# Patient Record
Sex: Female | Born: 1970 | Race: White | Hispanic: No | Marital: Married | Smoking: Never smoker
Health system: Southern US, Community
[De-identification: ages and names within clinical notes are randomized; demographics above are authoritative.]

## PROBLEM LIST (undated history)

## (undated) DIAGNOSIS — F419 Anxiety disorder, unspecified: Secondary | ICD-10-CM

## (undated) DIAGNOSIS — N9489 Other specified conditions associated with female genital organs and menstrual cycle: Secondary | ICD-10-CM

## (undated) DIAGNOSIS — R040 Epistaxis: Secondary | ICD-10-CM

## (undated) DIAGNOSIS — K219 Gastro-esophageal reflux disease without esophagitis: Secondary | ICD-10-CM

## (undated) DIAGNOSIS — K589 Irritable bowel syndrome without diarrhea: Secondary | ICD-10-CM

## (undated) DIAGNOSIS — N281 Cyst of kidney, acquired: Secondary | ICD-10-CM

## (undated) DIAGNOSIS — M1990 Unspecified osteoarthritis, unspecified site: Secondary | ICD-10-CM

## (undated) DIAGNOSIS — H8109 Meniere's disease, unspecified ear: Secondary | ICD-10-CM

## (undated) HISTORY — PX: ABLATION: SHX5711

## (undated) HISTORY — PX: APPENDECTOMY: SHX54

## (undated) HISTORY — PX: TUBAL LIGATION: SHX77

## (undated) HISTORY — PX: WISDOM TOOTH EXTRACTION: SHX21

## (undated) HISTORY — PX: TONSILLECTOMY: SUR1361

## (undated) HISTORY — PX: LAPAROSCOPY ABDOMEN DIAGNOSTIC: PRO50

## (undated) HISTORY — DX: Epistaxis: R04.0

## (undated) HISTORY — PX: ABDOMINAL HYSTERECTOMY: SHX81

## (undated) HISTORY — DX: Meniere's disease, unspecified ear: H81.09

## (undated) HISTORY — PX: BREAST SURGERY: SHX581

## (undated) HISTORY — DX: Anxiety disorder, unspecified: F41.9

## (undated) HISTORY — PX: CHOLECYSTECTOMY: SHX55

## (undated) HISTORY — DX: Irritable bowel syndrome, unspecified: K58.9

## (undated) HISTORY — DX: Cyst of kidney, acquired: N28.1

## (undated) HISTORY — PX: DILATION AND CURETTAGE OF UTERUS: SHX78

## (undated) HISTORY — DX: Unspecified osteoarthritis, unspecified site: M19.90

## (undated) HISTORY — DX: Other specified conditions associated with female genital organs and menstrual cycle: N94.89

## (undated) HISTORY — DX: Gastro-esophageal reflux disease without esophagitis: K21.9

---

## 2002-03-10 ENCOUNTER — Encounter: Payer: Self-pay | Admitting: Internal Medicine

## 2006-06-16 ENCOUNTER — Encounter: Payer: Self-pay | Admitting: Internal Medicine

## 2008-03-19 ENCOUNTER — Encounter: Payer: Self-pay | Admitting: Internal Medicine

## 2008-05-11 HISTORY — PX: BREAST EXCISIONAL BIOPSY: SUR124

## 2009-01-18 ENCOUNTER — Encounter: Payer: Self-pay | Admitting: Internal Medicine

## 2011-01-08 ENCOUNTER — Encounter: Payer: Self-pay | Admitting: Internal Medicine

## 2011-01-29 ENCOUNTER — Encounter: Payer: Self-pay | Admitting: Internal Medicine

## 2011-07-25 ENCOUNTER — Encounter: Payer: Self-pay | Admitting: Internal Medicine

## 2011-08-19 ENCOUNTER — Encounter: Payer: Self-pay | Admitting: Internal Medicine

## 2011-08-30 ENCOUNTER — Encounter: Payer: Self-pay | Admitting: Internal Medicine

## 2012-11-05 ENCOUNTER — Encounter: Payer: Self-pay | Admitting: Internal Medicine

## 2013-05-11 HISTORY — PX: BREAST BIOPSY: SHX20

## 2015-05-20 LAB — HM PAP SMEAR: HM Pap smear: NORMAL

## 2017-10-20 LAB — HM MAMMOGRAPHY

## 2018-04-19 ENCOUNTER — Ambulatory Visit: Payer: BLUE CROSS/BLUE SHIELD | Admitting: Internal Medicine

## 2018-04-19 ENCOUNTER — Encounter: Payer: Self-pay | Admitting: Internal Medicine

## 2018-04-19 VITALS — BP 106/70 | HR 87 | Temp 97.9°F | Ht 68.0 in | Wt 116.0 lb

## 2018-04-19 DIAGNOSIS — K219 Gastro-esophageal reflux disease without esophagitis: Secondary | ICD-10-CM | POA: Insufficient documentation

## 2018-04-19 DIAGNOSIS — K580 Irritable bowel syndrome with diarrhea: Secondary | ICD-10-CM | POA: Diagnosis not present

## 2018-04-19 DIAGNOSIS — Z1329 Encounter for screening for other suspected endocrine disorder: Secondary | ICD-10-CM

## 2018-04-19 DIAGNOSIS — Z9049 Acquired absence of other specified parts of digestive tract: Secondary | ICD-10-CM

## 2018-04-19 DIAGNOSIS — E559 Vitamin D deficiency, unspecified: Secondary | ICD-10-CM

## 2018-04-19 DIAGNOSIS — Z23 Encounter for immunization: Secondary | ICD-10-CM

## 2018-04-19 DIAGNOSIS — K589 Irritable bowel syndrome without diarrhea: Secondary | ICD-10-CM | POA: Insufficient documentation

## 2018-04-19 DIAGNOSIS — Z1389 Encounter for screening for other disorder: Secondary | ICD-10-CM

## 2018-04-19 DIAGNOSIS — Z0000 Encounter for general adult medical examination without abnormal findings: Secondary | ICD-10-CM

## 2018-04-19 DIAGNOSIS — Z1159 Encounter for screening for other viral diseases: Secondary | ICD-10-CM

## 2018-04-19 DIAGNOSIS — Z13220 Encounter for screening for lipoid disorders: Secondary | ICD-10-CM

## 2018-04-19 DIAGNOSIS — Z0184 Encounter for antibody response examination: Secondary | ICD-10-CM

## 2018-04-19 MED ORDER — CHOLESTYRAMINE 4 G PO PACK: PACK | ORAL | 3 refills | Status: DC

## 2018-04-19 MED ORDER — PANTOPRAZOLE SODIUM 20 MG PO TBEC: 20.0000 mg | DELAYED_RELEASE_TABLET | Freq: Every day | ORAL | 3 refills | Status: DC

## 2018-04-19 NOTE — Patient Instructions (Addendum)
D3 2000 IU daily  Calcium 600 mg 1-2x per day   Pepcid AC for GERD symptoms OTC  Consider Tdap vaccine   Tdap DTaP Vaccine (Diphtheria, Tetanus, and Pertussis): What You Need to Know 1. Why get vaccinated? Diphtheria, tetanus, and pertussis are serious diseases caused by bacteria. Diphtheria and pertussis are spread from person to person. Tetanus enters the body through cuts or wounds. DIPHTHERIA causes a thick covering in the back of the throat.  It can lead to breathing problems, paralysis, heart failure, and even death.  TETANUS (Lockjaw) causes painful tightening of the muscles, usually all over the body.  It can lead to "locking" of the jaw so the victim cannot open his mouth or swallow. Tetanus leads to death in up to 2 out of 10 cases.  PERTUSSIS (Whooping Cough) causes coughing spells so bad that it is hard for infants to eat, drink, or breathe. These spells can last for weeks.  It can lead to pneumonia, seizures (jerking and staring spells), brain damage, and death.  Diphtheria, tetanus, and pertussis vaccine (DTaP) can help prevent these diseases. Most children who are vaccinated with DTaP will be protected throughout childhood. Many more children would get these diseases if we stopped vaccinating. DTaP is a safer version of an older vaccine called DTP. DTP is no longer used in the [place]. 2. Who should get DTaP vaccine and when? Children should get 5 doses of DTaP vaccine, one dose at each of the following ages:  2 months  4 months  6 months  15-18 months  4-6 years  DTaP may be given at the same time as other vaccines. 3. Some children should not get DTaP vaccine or should wait  Children with minor illnesses, such as a cold, may be vaccinated. But children who are moderately or severely ill should usually wait until they recover before getting DTaP vaccine.  Any child who had a life-threatening allergic reaction after a dose of DTaP should not get another  dose.  Any child who suffered a brain or nervous system disease within 7 days after a dose of DTaP should not get another dose.  Talk with your doctor if your child: ? had a seizure or collapsed after a dose of DTaP, ? cried non-stop for 3 hours or more after a dose of DTaP, ? had a fever over 105F after a dose of DTaP. Ask your doctor for more information. Some of these children should not get another dose of pertussis vaccine, but may get a vaccine without pertussis, called DT. 4. Older children and adults DTaP is not licensed for adolescents, adults, or children 667 years of age and older. But older people still need protection. A vaccine called Tdap is similar to DTaP. A single dose of Tdap is recommended for people 11 through 48 years of age. Another vaccine, called Td, protects against tetanus and diphtheria, but not pertussis. It is recommended every 10 years. There are separate Vaccine Information Statements for these vaccines. 5. What are the risks from DTaP vaccine? Getting diphtheria, tetanus, or pertussis disease is much riskier than getting DTaP vaccine. However, a vaccine, like any medicine, is capable of causing serious problems, such as severe allergic reactions. The risk of DTaP vaccine causing serious harm, or death, is extremely small. Mild problems (common)  Fever (up to about 1 child in 4)  Redness or swelling where the shot was given (up to about 1 child in 4)  Soreness or tenderness where the shot  was given (up to about 1 child in 4) These problems occur more often after the 4th and 5th doses of the DTaP series than after earlier doses. Sometimes the 4th or 5th dose of DTaP vaccine is followed by swelling of the entire arm or leg in which the shot was given, lasting 1-7 days (up to about 1 child in 30). Other mild problems include:  Fussiness (up to about 1 child in 3)  Tiredness or poor appetite (up to about 1 child in 10)  Vomiting (up to about 1 child in  50) These problems generally occur 1-3 days after the shot. Moderate problems (uncommon)  Seizure (jerking or staring) (about 1 child out of 14,000)  Non-stop crying, for 3 hours or more (up to about 1 child out of 1,000)  High fever, over 105F (about 1 child out of 16,000) Severe problems (very rare)  Serious allergic reaction (less than 1 out of a million doses)  Several other severe problems have been reported after DTaP vaccine. These include: ? Long-term seizures, coma, or lowered consciousness ? Permanent brain damage. These are so rare it is hard to tell if they are caused by the vaccine. Controlling fever is especially important for children who have had seizures, for any reason. It is also important if another family member has had seizures. You can reduce fever and pain by giving your child an aspirin-free pain reliever when the shot is given, and for the next 24 hours, following the package instructions. 6. What if there is a serious reaction? What should I look for? Look for anything that concerns you, such as signs of a severe allergic reaction, very high fever, or behavior changes. Signs of a severe allergic reaction can include hives, swelling of the face and throat, difficulty breathing, a fast heartbeat, dizziness, and weakness. These would start a few minutes to a few hours after the vaccination. What should I do?  If you think it is a severe allergic reaction or other emergency that can't wait, call 9-1-1 or get the person to the nearest hospital. Otherwise, call your doctor.  Afterward, the reaction should be reported to the Vaccine Adverse Event Reporting System (VAERS). Your doctor might file this report, or you can do it yourself through the VAERS web site at www.vaers.LAgents.no, or by calling 1-540-359-2895. ? VAERS is only for reporting reactions. They do not give medical advice. 7. The National Vaccine Injury Compensation Program The [employer] Vaccine Injury  Compensation Program (VICP) is a federal program that was created to compensate people who may have been injured by certain vaccines. Persons who believe they may have been injured by a vaccine can learn about the program and about filing a claim by calling 1-804-715-5118 or visiting the VICP website at SpiritualWord.at. 8. How can I learn more?  Ask your doctor.  Call your local or state health department.  Contact the Centers for Disease Control and Prevention (CDC): ? Call 430-128-9707 (1-800-CDC-INFO) or ? Visit CDC's website at PicCapture.uy CDC DTaP Vaccine (Diphtheria, Tetanus, and Pertussis) VIS (09/24/05) This information is not intended to replace advice given to you by your health care provider. Make sure you discuss any questions you have with your health care provider. Document Released: 02/22/2006 Document Revised: 01/16/2016 Document Reviewed: 01/16/2016 Elsevier Interactive Patient Education  2017 [employer].

## 2018-04-19 NOTE — Progress Notes (Signed)
Pre visit review using our clinic review tool, if applicable. No additional management support is needed unless otherwise documented below in the visit note. 

## 2018-04-19 NOTE — Progress Notes (Addendum)
Chief Complaint  Patient presents with  . Establish Care   New patient  1. IBS-D and GERD protonix 40 mg qd helps and cholestyramine daily helps s/p GB removal  2. No complaints today wants flu shot     Review of Systems  Constitutional: Negative for weight loss.  HENT: Negative for hearing loss.   Eyes: Negative for blurred vision.  Respiratory: Negative for shortness of breath.   Cardiovascular: Negative for chest pain.  Gastrointestinal: Negative for abdominal pain.  Musculoskeletal: Negative for falls.  Skin: Negative for rash.  Neurological: Negative for headaches.  Psychiatric/Behavioral: Negative for depression.   History reviewed. No pertinent past medical history. Past Surgical History:  Procedure Laterality Date  . ABDOMINAL HYSTERECTOMY     ovaries intact   . ABLATION    . TUBAL LIGATION     Family History  Problem Relation Age of Onset  . Arthritis Mother   . Hypertension Mother   . Arthritis Father   . Hypertension Father   . Diabetes Father   . Hearing loss Father   . Hyperlipidemia Father   . Cancer Paternal Aunt        gu cancer ? type  . Rheumatic fever Paternal Aunt   . Mental illness Paternal Aunt        cognitive impairment  . Arthritis Maternal Grandmother   . Diabetes Maternal Grandmother   . Hyperlipidemia Maternal Grandmother   . Hypertension Maternal Grandmother   . Arthritis Maternal Grandfather   . Hyperlipidemia Maternal Grandfather   . Hypertension Maternal Grandfather   . Arthritis Paternal Grandmother   . Diabetes Paternal Grandmother   . Hyperlipidemia Paternal Grandmother   . Hypertension Paternal Grandmother   . Arthritis Paternal Grandfather   . Hyperlipidemia Paternal Grandfather   . Hypertension Paternal Grandfather   . Arthritis Sister   . Miscarriages / [place] Sister    Social History   Socioeconomic History  . Marital status: Married    Spouse name: Not on file  . Number of children: Not on file  . Years  of education: Not on file  . Highest education level: Not on file  Occupational History  . Not on file  Social Needs  . Financial resource strain: Not on file  . Food insecurity:    Worry: Not on file    Inability: Not on file  . Transportation needs:    Medical: Not on file    Non-medical: Not on file  Tobacco Use  . Smoking status: Never Smoker  . Smokeless tobacco: Never Used  Substance and Sexual Activity  . Alcohol use: Yes  . Drug use: Not Currently  . Sexual activity: Yes    Partners: Male  Lifestyle  . Physical activity:    Days per week: Not on file    Minutes per session: Not on file  . Stress: Not on file  Relationships  . Social connections:    Talks on phone: Not on file    Gets together: Not on file    Attends religious service: Not on file    Active member of club or organization: Not on file    Attends meetings of clubs or organizations: Not on file    Relationship status: Not on file  . Intimate partner violence:    Fear of current or ex partner: Not on file    Emotionally abused: Not on file    Physically abused: Not on file    Forced sexual activity: Not on  file  Other Topics Concern  . Not on file  Social History Narrative  . Not on file   Current Meds  Medication Sig  . cholestyramine (QUESTRAN) 4 g packet USE 1 PACKET MIXED AS DIRECTED DAILY  . pantoprazole (PROTONIX) 20 MG tablet Take 1 tablet (20 mg total) by mouth daily. 30 minutes  . [DISCONTINUED] cholestyramine (QUESTRAN) 4 g packet USE 1 PACKET MIXED AS DIRECTED DAILY  . [DISCONTINUED] pantoprazole (PROTONIX) 40 MG tablet Take 40 mg by mouth daily.   Allergies  Allergen Reactions  . Other     Dairy and peanuts    No results found for this or any previous visit (from the past 2160 hour(s)). Objective  Body mass index is 17.64 kg/m. Wt Readings from Last 3 Encounters:  04/19/18 116 lb (52.6 kg)   Temp Readings from Last 3 Encounters:  04/19/18 97.9 F (36.6 C) (Oral)   BP  Readings from Last 3 Encounters:  04/19/18 106/70   Pulse Readings from Last 3 Encounters:  04/19/18 87    Physical Exam  Constitutional: She is oriented to person, place, and time. Vital signs are normal. She appears well-developed and well-nourished. She is cooperative.  HENT:  Head: Normocephalic and atraumatic.  Mouth/Throat: Oropharynx is clear and moist and mucous membranes are normal.  Eyes: Pupils are equal, round, and reactive to light. Conjunctivae are normal.  Cardiovascular: Normal rate, regular rhythm and normal heart sounds.  Pulmonary/Chest: Effort normal and breath sounds normal.  Neurological: She is alert and oriented to person, place, and time. Gait normal.  Skin: Skin is warm, dry and intact.  Psychiatric: She has a normal mood and affect. Her speech is normal and behavior is normal. Judgment and thought content normal. Cognition and memory are normal.  Nursing note and vitals reviewed.   Assessment   1. IBS-D and GERD 2. HM Plan   1. Refilled cholestyramine  Reduced protonix to 20 mg qd and consider pepcid ac otc  2.  Flu shot given today  Disc Tdap today no recent in old PCP notes  Consider shingrix age 47 y.o  Pap had 05/2017 get records obtained 05/20/15 normal neg HPV-ask at f/u about hysterectomy mammo 10/2017 get records obtained nl 10/20/17 Egd/colonoscopy had 2017 get records  -per notes colonoscopy in 08/2006 and 12/2012 2/2 diarrhea + internal hemorrhoids bxs negative for colitis  -EGD/colonoscopy 09/02/06 mild distal esophagitis/mild gastritis, small hiatal hernia; no polyps or colitis  sch fasting labs  Never smoker  HIV neg 09/29/16   sch fasting labs  Reviewed pt records from (Dr. Angelica Chessman) [facility] in [place] PMH hemorrhoids H/o gastritis with gastric polyp (was on protonix 40 mg qd in the past as well as prilosec 20, nexium 40), ext hemorrhoids, pelvic congestion syndrome f/u OB/GYN  Psoriasis noted in note  Fibrocystic breast disease   GERD  Lactose intolerance  H/o leukopenia 3.6 10/03/15  H/o tick bite left leg 08/26/15  IBS-D H/o ovarian cyst resolved was complex 2.8 cm noted [place] 05/27/02  H/o mole removal right shoulder junctional nevus benign  Left neck bx benign compound nevus  Upper mid back bx intradermal nevus  MRI brain and IACS normal 04/06/05 and CT head normal 02/18/05  CT ab/pelvis 09/14/14 marked enlargement of left uterine vein, pelvic congestion syndrome not excluded otherwise unremarkable H/o menieres disease left ear 2006 saw ENT H/o kidney cysts b/l [place] 08/20/11   [facility]  Uterine ablation  GB removal 08/2010 path with cholelithiasis and chronic  cholecystitis  EGD 08/2006 and 12/2012  -EGD 09/02/06 esophagus bx negative barretts or malig. Gastric bx no ulcer or malig, benign glands mild chronic gastritis  Tubal ligation  D&C Diagnostic laparoscopy Hysteroscopy 10/29/04 D&C endometrial ablation DUB  Right breast bx 03/09/2007 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 so due to size excised path fibrocystic changes fibrous mastopathy, columnar cell change w/o atypia focal sclerosis adenosis   FH  DVT in sister  p aunt endometrial cancer   Provider: Dr. French Ana McLean-Scocuzza-[facility]

## 2018-04-28 ENCOUNTER — Other Ambulatory Visit: Payer: BLUE CROSS/BLUE SHIELD

## 2018-05-02 ENCOUNTER — Encounter: Payer: Self-pay | Admitting: Internal Medicine

## 2018-05-03 LAB — HM HIV SCREENING LAB: HM HIV Screening: NEGATIVE

## 2018-05-03 NOTE — Addendum Note (Signed)
Addended by: Quentin OreMCLEAN-SCOCUZZA, Firman Petrow on: 05/03/2018 10:53 AM   Modules accepted: Orders

## 2018-05-17 ENCOUNTER — Ambulatory Visit: Payer: Self-pay | Admitting: Physician Assistant

## 2018-05-19 ENCOUNTER — Other Ambulatory Visit (INDEPENDENT_AMBULATORY_CARE_PROVIDER_SITE_OTHER): Payer: BLUE CROSS/BLUE SHIELD

## 2018-05-19 DIAGNOSIS — Z1389 Encounter for screening for other disorder: Secondary | ICD-10-CM

## 2018-05-19 DIAGNOSIS — Z13220 Encounter for screening for lipoid disorders: Secondary | ICD-10-CM

## 2018-05-19 DIAGNOSIS — E559 Vitamin D deficiency, unspecified: Secondary | ICD-10-CM

## 2018-05-19 DIAGNOSIS — Z0000 Encounter for general adult medical examination without abnormal findings: Secondary | ICD-10-CM | POA: Diagnosis not present

## 2018-05-19 DIAGNOSIS — Z1329 Encounter for screening for other suspected endocrine disorder: Secondary | ICD-10-CM

## 2018-05-19 DIAGNOSIS — Z1159 Encounter for screening for other viral diseases: Secondary | ICD-10-CM

## 2018-05-19 DIAGNOSIS — Z0184 Encounter for antibody response examination: Secondary | ICD-10-CM

## 2018-05-19 LAB — VITAMIN D 25 HYDROXY (VIT D DEFICIENCY, FRACTURES): VITD: 35.52 ng/mL (ref 30.00–100.00)

## 2018-05-19 LAB — CBC WITH DIFFERENTIAL/PLATELET
Basophils Absolute: 0 10*3/uL (ref 0.0–0.1)
Basophils Relative: 1 % (ref 0.0–3.0)
Eosinophils Absolute: 0 10*3/uL (ref 0.0–0.7)
Eosinophils Relative: 0.6 % (ref 0.0–5.0)
HCT: 41.6 % (ref 36.0–46.0)
Hemoglobin: 14 g/dL (ref 12.0–15.0)
Lymphocytes Relative: 27.3 % (ref 12.0–46.0)
Lymphs Abs: 1.2 10*3/uL (ref 0.7–4.0)
MCHC: 33.6 g/dL (ref 30.0–36.0)
MCV: 95.4 fl (ref 78.0–100.0)
Monocytes Absolute: 0.3 10*3/uL (ref 0.1–1.0)
Monocytes Relative: 6.8 % (ref 3.0–12.0)
Neutro Abs: 2.7 10*3/uL (ref 1.4–7.7)
Neutrophils Relative %: 64.3 % (ref 43.0–77.0)
Platelets: 140 10*3/uL — ABNORMAL LOW (ref 150.0–400.0)
RBC: 4.36 Mil/uL (ref 3.87–5.11)
RDW: 12.8 % (ref 11.5–15.5)
WBC: 4.2 10*3/uL (ref 4.0–10.5)

## 2018-05-19 LAB — LIPID PANEL
Cholesterol: 154 mg/dL (ref 0–200)
HDL: 72.5 mg/dL (ref >39.00)
LDL Cholesterol: 72 mg/dL (ref 0–99)
NonHDL: 81.82
Total CHOL/HDL Ratio: 2
Triglycerides: 49 mg/dL (ref 0.0–149.0)
VLDL: 9.8 mg/dL (ref 0.0–40.0)

## 2018-05-19 LAB — COMPREHENSIVE METABOLIC PANEL
ALT: 15 U/L (ref 0–35)
AST: 18 U/L (ref 0–37)
Albumin: 4.4 g/dL (ref 3.5–5.2)
Alkaline Phosphatase: 23 U/L — ABNORMAL LOW (ref 39–117)
BUN: 17 mg/dL (ref 6–23)
CO2: 29 mEq/L (ref 19–32)
Calcium: 9.2 mg/dL (ref 8.4–10.5)
Chloride: 102 mEq/L (ref 96–112)
Creatinine, Ser: 0.8 mg/dL (ref 0.40–1.20)
GFR: 81.08 mL/min (ref >60.00)
Glucose, Bld: 83 mg/dL (ref 70–99)
Potassium: 4.2 mEq/L (ref 3.5–5.1)
Sodium: 137 mEq/L (ref 135–145)
Total Bilirubin: 0.8 mg/dL (ref 0.2–1.2)
Total Protein: 6.8 g/dL (ref 6.0–8.3)

## 2018-05-19 LAB — T4, FREE: Free T4: 0.81 ng/dL (ref 0.60–1.60)

## 2018-05-19 LAB — TSH: TSH: 1.9 u[IU]/mL (ref 0.35–4.50)

## 2018-05-19 NOTE — Addendum Note (Signed)
Addended by: Warden FillersWRIGHT, Natajah Derderian S on: 05/19/2018 09:05 AM   Modules accepted: Orders

## 2018-05-20 LAB — URINALYSIS, ROUTINE W REFLEX MICROSCOPIC
Bilirubin, UA: NEGATIVE
Glucose, UA: NEGATIVE
Ketones, UA: NEGATIVE
Leukocytes, UA: NEGATIVE
Nitrite, UA: NEGATIVE
Protein, UA: NEGATIVE
RBC, UA: NEGATIVE
Specific Gravity, UA: 1.023 (ref 1.005–1.030)
Urobilinogen, Ur: 0.2 mg/dL (ref 0.2–1.0)
pH, UA: 7.5 (ref 5.0–7.5)

## 2018-05-20 LAB — HEPATITIS B SURFACE ANTIBODY, QUANTITATIVE: Hep B S AB Quant (Post): <5 m[IU]/mL — ABNORMAL LOW (ref >=10)

## 2018-05-20 LAB — MEASLES/MUMPS/RUBELLA IMMUNITY
Mumps IgG: 20.6 AU/mL
Rubella: 15.3 index
Rubeola IgG: >300 AU/mL

## 2018-09-21 ENCOUNTER — Ambulatory Visit (INDEPENDENT_AMBULATORY_CARE_PROVIDER_SITE_OTHER): Payer: BLUE CROSS/BLUE SHIELD | Admitting: Internal Medicine

## 2018-09-21 ENCOUNTER — Encounter: Payer: Self-pay | Admitting: Internal Medicine

## 2018-09-21 ENCOUNTER — Other Ambulatory Visit: Payer: Self-pay

## 2018-09-21 VITALS — BP 101/60 | HR 61 | Temp 96.7°F | Wt 115.0 lb

## 2018-09-21 DIAGNOSIS — K219 Gastro-esophageal reflux disease without esophagitis: Secondary | ICD-10-CM

## 2018-09-21 DIAGNOSIS — Z9049 Acquired absence of other specified parts of digestive tract: Secondary | ICD-10-CM | POA: Insufficient documentation

## 2018-09-21 DIAGNOSIS — Z1231 Encounter for screening mammogram for malignant neoplasm of breast: Secondary | ICD-10-CM | POA: Diagnosis not present

## 2018-09-21 DIAGNOSIS — R232 Flushing: Secondary | ICD-10-CM | POA: Diagnosis not present

## 2018-09-21 DIAGNOSIS — M2550 Pain in unspecified joint: Secondary | ICD-10-CM

## 2018-09-21 DIAGNOSIS — D696 Thrombocytopenia, unspecified: Secondary | ICD-10-CM

## 2018-09-21 DIAGNOSIS — R921 Mammographic calcification found on diagnostic imaging of breast: Secondary | ICD-10-CM | POA: Diagnosis not present

## 2018-09-21 DIAGNOSIS — N9489 Other specified conditions associated with female genital organs and menstrual cycle: Secondary | ICD-10-CM | POA: Diagnosis not present

## 2018-09-21 DIAGNOSIS — K580 Irritable bowel syndrome with diarrhea: Secondary | ICD-10-CM

## 2018-09-21 DIAGNOSIS — D72819 Decreased white blood cell count, unspecified: Secondary | ICD-10-CM | POA: Insufficient documentation

## 2018-09-21 HISTORY — DX: Acquired absence of other specified parts of digestive tract: Z90.49

## 2018-09-21 MED ORDER — CHOLESTYRAMINE 4 G PO PACK: PACK | ORAL | 3 refills | Status: DC

## 2018-09-21 MED ORDER — PANTOPRAZOLE SODIUM 40 MG PO TBEC: 40.0000 mg | DELAYED_RELEASE_TABLET | Freq: Every day | ORAL | 3 refills | Status: DC

## 2018-09-21 NOTE — Progress Notes (Signed)
Virtual Visit via Video Note  I connected with Stacey [name]  on 09/21/18 at  8:45 AM EDT by a video enabled telemedicine application and verified that I am speaking with the correct person using two identifiers.  Location patient: home Location provider:work  Persons participating in the virtual visit: patient, provider  I discussed the limitations of evaluation and management by telemedicine and the availability of in person appointments. The patient expressed understanding and agreed to proceed.   HPI: Female pelvic congestion syndrome reviewed CT ab/pelvis 2016 with increased GYN varices worse on left-disc ob/gyn referral Dr. Vikki Ports Ward in the future pt agreeable having left sided abdominal pain intermittently increased x last 2 weeks feels like gas pain worse with dairy and nuts which she tries to avoid pain goes away w/o any medications   Hot flashes and night sweats likely menopause pt declines medications for now   Gastroesophageal reflux disease, esophagitis presence not specified - Plan: pantoprazole (PROTONIX) 40 MG tablet no help with pepcid ac nor protonix 20 mg   Irritable bowel syndrome with diarrhea - Plan: cholestyramine (QUESTRAN) 4 g packet needs refill  History of cholecystectomy - Plan: cholestyramine (QUESTRAN) 4 g packet  Thrombocytopenia (HCC) and low WBC this has been ongoing since delivery of her 2nd child      ROS: See pertinent positives and negatives per HPI.  Past Medical History:  Diagnosis Date  . Bleeding from the nose    nose cauterized 1980s  . GERD (gastroesophageal reflux disease)   . IBS (irritable bowel syndrome)   . Kidney cysts   . Meniere disease    left hearing loss 2012   . Pelvic congestion syndrome     Past Surgical History:  Procedure Laterality Date  . ABDOMINAL HYSTERECTOMY     ovaries intact 2017 2/2 DUB  . ABLATION     DUB 10/29/04  . BREAST SURGERY     bx in 2017/2018 neg dense breast; clip placement right  breast, 2008 fibroadenoma breast bx  . CHOLECYSTECTOMY     2015  . DILATION AND CURETTAGE OF UTERUS    . LAPAROSCOPY ABDOMEN DIAGNOSTIC    . TONSILLECTOMY     1983  . TUBAL LIGATION    . WISDOM TOOTH EXTRACTION     1989    Family History  Problem Relation Age of Onset  . Arthritis Mother   . Hypertension Mother   . Arthritis Father   . Hypertension Father   . Diabetes Father   . Hearing loss Father   . Hyperlipidemia Father   . Cancer Paternal Aunt        gu cancer ? type-endometrial  . Rheumatic fever Paternal Aunt   . Mental illness Paternal Aunt        cognitive impairment  . Arthritis Maternal Grandmother   . Diabetes Maternal Grandmother   . Hyperlipidemia Maternal Grandmother   . Hypertension Maternal Grandmother   . Arthritis Maternal Grandfather   . Hyperlipidemia Maternal Grandfather   . Hypertension Maternal Grandfather   . Arthritis Paternal Grandmother   . Diabetes Paternal Grandmother   . Hyperlipidemia Paternal Grandmother   . Hypertension Paternal Grandmother   . Arthritis Paternal Grandfather   . Hyperlipidemia Paternal Grandfather   . Hypertension Paternal Grandfather   . Arthritis Sister   . Miscarriages / Stillbirths Sister   . Deep vein thrombosis Sister     SOCIAL HX: married with kids   Current Outpatient Medications:  .  cholestyramine (QUESTRAN) 4 g  packet, USE 1 PACKET MIXED AS DIRECTED DAILY, Disp: 180 each, Rfl: 3 .  pantoprazole (PROTONIX) 40 MG tablet, Take 1 tablet (40 mg total) by mouth daily. 30 minutes before food, Disp: 90 tablet, Rfl: 3  EXAM:  VITALS per patient if applicable reviewed 493/24, hr 61, temp 96.7, wt 115 stable  GENERAL: alert, oriented, appears well and in no acute distress  HEENT: atraumatic, conjunttiva clear, no obvious abnormalities on inspection of external nose and ears  NECK: normal movements of the head and neck  LUNGS: on inspection no signs of respiratory distress, breathing rate appears normal,  no obvious gross SOB, gasping or wheezing  CV: no obvious cyanosis  MS: moves all visible extremities without noticeable abnormality  PSYCH/NEURO: pleasant and cooperative, no obvious depression or anxiety, speech and thought processing grossly intact  ASSESSMENT AND PLAN:  Discussed the following assessment and plan:  Female pelvic congestion syndrome reviewed CT ab/pelvis 2016 with increased GYN varices worse on left-disc ob/gyn referral Dr. Vikki Ports Ward in the future pt agreeable having left sided abdominal pain intermittently increased x last 2 weeks  -refer ob/gyn in future consider repeat US imaging vs CT   Hot flashes and night sweats likely menopause pt declines medications for now   Gastroesophageal reflux disease, esophagitis presence not specified - Plan: pantoprazole (PROTONIX) 40 MG tablet no help with pepcid ac nor protonix 20 mg   Irritable bowel syndrome with diarrhea - Plan: cholestyramine (QUESTRAN) 4 g packet  History of cholecystectomy - Plan: cholestyramine (QUESTRAN) 4 g packet  Thrombocytopenia (HCC)-monitor plts   HM Flu shot utd  Disc Tdap today no recent in old PCP notes  MMR immune  rec hep B vaccine Consider shingrix age 48 y.o   Pap had 05/2017 get records obtained 05/20/15 normal neg HPV no h/o abnormal pap s/p hysterectomy with b/l ovaries intact   mammo 10/2017 get records obtained nl 10/20/17 -referred mammogram has right breast clip h/o breast calcifications, h/o fibroadenoma  -given phone # to call [place] today  Right breast bx 03/09/2007 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 so due to size excised path fibrocystic changes fibrous mastopathy, columnar cell change w/o atypia focal sclerosis adenosis    Egd/colonoscopy had 2017 get records  -per notes colonoscopy in 08/2006 and 12/2012 2/2 diarrhea + internal hemorrhoids bxs negative for colitis  -EGD/colonoscopy 09/02/06 mild distal esophagitis/mild gastritis, small hiatal hernia; no  polyps or colitis  -repeat colonoscopy due 12/2022    Never smoker  HIV neg 09/29/16   Reviewed pt records from (Dr. Elsie Amis) [facility] in [place] see 1st note     of note pt taking vitamin D, C, B complex, elderberry, glucosamine   I discussed the assessment and treatment plan with the patient. The patient was provided an opportunity to ask questions and all were answered. The patient agreed with the plan and demonstrated an understanding of the instructions.   The patient was advised to call back or seek an in-person evaluation if the symptoms worsen or if the condition fails to improve as anticipated.  Time spent 15 minutes  Delorise Jackson, MD

## 2018-09-28 ENCOUNTER — Other Ambulatory Visit: Payer: Self-pay | Admitting: Internal Medicine

## 2018-09-28 DIAGNOSIS — W57XXXA Bitten or stung by nonvenomous insect and other nonvenomous arthropods, initial encounter: Secondary | ICD-10-CM

## 2018-09-28 DIAGNOSIS — L309 Dermatitis, unspecified: Secondary | ICD-10-CM

## 2018-09-28 MED ORDER — TRIAMCINOLONE ACETONIDE 0.1 % EX CREA: 1.0000 "application " | TOPICAL_CREAM | Freq: Two times a day (BID) | CUTANEOUS | 0 refills | Status: DC

## 2018-09-28 NOTE — Progress Notes (Signed)
Saw pt on virtual visit though not her virtual visit was husband ronalds 09/28/2018    X 2-3 days had itchy bumps to b/l hands 3 fingers red she is not sure if bug bites she was gardening w/o gloves   ddx contact derm, bug bites less likely bug bites  [facility] bid prn to hands  Call back to schedule visit if not better    TMS

## 2018-09-29 ENCOUNTER — Encounter: Payer: Self-pay | Admitting: Internal Medicine

## 2018-11-21 ENCOUNTER — Ambulatory Visit
Admission: RE | Admit: 2018-11-21 | Discharge: 2018-11-21 | Disposition: A | Discharge status: Home / Self Care | Payer: Managed Care, Other (non HMO) | Source: Ambulatory Visit | Attending: Internal Medicine | Admitting: Internal Medicine

## 2018-11-21 ENCOUNTER — Other Ambulatory Visit: Payer: Self-pay

## 2018-11-21 DIAGNOSIS — R921 Mammographic calcification found on diagnostic imaging of breast: Secondary | ICD-10-CM

## 2018-11-21 DIAGNOSIS — Z1231 Encounter for screening mammogram for malignant neoplasm of breast: Secondary | ICD-10-CM | POA: Diagnosis not present

## 2019-03-30 ENCOUNTER — Ambulatory Visit: Payer: BLUE CROSS/BLUE SHIELD | Admitting: Internal Medicine

## 2019-03-30 DIAGNOSIS — Z20828 Contact with and (suspected) exposure to other viral communicable diseases: Secondary | ICD-10-CM | POA: Diagnosis not present

## 2019-03-30 DIAGNOSIS — U071 COVID-19: Secondary | ICD-10-CM | POA: Diagnosis not present

## 2019-04-13 DIAGNOSIS — Z20828 Contact with and (suspected) exposure to other viral communicable diseases: Secondary | ICD-10-CM | POA: Diagnosis not present

## 2019-04-13 DIAGNOSIS — U071 COVID-19: Secondary | ICD-10-CM | POA: Diagnosis not present

## 2019-04-20 DIAGNOSIS — U071 COVID-19: Secondary | ICD-10-CM | POA: Diagnosis not present

## 2019-04-20 DIAGNOSIS — Z20828 Contact with and (suspected) exposure to other viral communicable diseases: Secondary | ICD-10-CM | POA: Diagnosis not present

## 2019-04-27 DIAGNOSIS — U071 COVID-19: Secondary | ICD-10-CM | POA: Diagnosis not present

## 2019-04-27 DIAGNOSIS — Z20828 Contact with and (suspected) exposure to other viral communicable diseases: Secondary | ICD-10-CM | POA: Diagnosis not present

## 2019-05-02 DIAGNOSIS — Z20828 Contact with and (suspected) exposure to other viral communicable diseases: Secondary | ICD-10-CM | POA: Diagnosis not present

## 2019-05-15 DIAGNOSIS — Z20828 Contact with and (suspected) exposure to other viral communicable diseases: Secondary | ICD-10-CM | POA: Diagnosis not present

## 2019-05-15 DIAGNOSIS — U071 COVID-19: Secondary | ICD-10-CM | POA: Diagnosis not present

## 2019-05-22 DIAGNOSIS — U071 COVID-19: Secondary | ICD-10-CM | POA: Diagnosis not present

## 2019-05-22 DIAGNOSIS — Z20828 Contact with and (suspected) exposure to other viral communicable diseases: Secondary | ICD-10-CM | POA: Diagnosis not present

## 2019-05-29 DIAGNOSIS — Z20828 Contact with and (suspected) exposure to other viral communicable diseases: Secondary | ICD-10-CM | POA: Diagnosis not present

## 2019-05-29 DIAGNOSIS — U071 COVID-19: Secondary | ICD-10-CM | POA: Diagnosis not present

## 2019-06-05 DIAGNOSIS — Z20828 Contact with and (suspected) exposure to other viral communicable diseases: Secondary | ICD-10-CM | POA: Diagnosis not present

## 2019-06-05 DIAGNOSIS — U071 COVID-19: Secondary | ICD-10-CM | POA: Diagnosis not present

## 2019-06-12 DIAGNOSIS — U071 COVID-19: Secondary | ICD-10-CM | POA: Diagnosis not present

## 2019-06-12 DIAGNOSIS — Z20828 Contact with and (suspected) exposure to other viral communicable diseases: Secondary | ICD-10-CM | POA: Diagnosis not present

## 2019-06-19 DIAGNOSIS — Z20828 Contact with and (suspected) exposure to other viral communicable diseases: Secondary | ICD-10-CM | POA: Diagnosis not present

## 2019-06-19 DIAGNOSIS — U071 COVID-19: Secondary | ICD-10-CM | POA: Diagnosis not present

## 2019-06-26 DIAGNOSIS — U071 COVID-19: Secondary | ICD-10-CM | POA: Diagnosis not present

## 2019-07-03 DIAGNOSIS — Z20828 Contact with and (suspected) exposure to other viral communicable diseases: Secondary | ICD-10-CM | POA: Diagnosis not present

## 2019-07-03 DIAGNOSIS — U071 COVID-19: Secondary | ICD-10-CM | POA: Diagnosis not present

## 2019-07-05 ENCOUNTER — Other Ambulatory Visit: Payer: Self-pay

## 2019-07-05 ENCOUNTER — Encounter: Payer: Self-pay | Admitting: Internal Medicine

## 2019-07-05 ENCOUNTER — Ambulatory Visit (INDEPENDENT_AMBULATORY_CARE_PROVIDER_SITE_OTHER): Payer: 59 | Admitting: Internal Medicine

## 2019-07-05 VITALS — BP 110/76 | HR 72 | Temp 96.9°F | Ht 68.0 in | Wt 119.2 lb

## 2019-07-05 DIAGNOSIS — N9489 Other specified conditions associated with female genital organs and menstrual cycle: Secondary | ICD-10-CM | POA: Diagnosis not present

## 2019-07-05 DIAGNOSIS — R102 Pelvic and perineal pain: Secondary | ICD-10-CM | POA: Diagnosis not present

## 2019-07-05 DIAGNOSIS — M2550 Pain in unspecified joint: Secondary | ICD-10-CM

## 2019-07-05 DIAGNOSIS — Z0000 Encounter for general adult medical examination without abnormal findings: Secondary | ICD-10-CM

## 2019-07-05 DIAGNOSIS — Z1231 Encounter for screening mammogram for malignant neoplasm of breast: Secondary | ICD-10-CM | POA: Diagnosis not present

## 2019-07-05 DIAGNOSIS — Z1329 Encounter for screening for other suspected endocrine disorder: Secondary | ICD-10-CM | POA: Diagnosis not present

## 2019-07-05 DIAGNOSIS — Z1389 Encounter for screening for other disorder: Secondary | ICD-10-CM

## 2019-07-05 DIAGNOSIS — Z13220 Encounter for screening for lipoid disorders: Secondary | ICD-10-CM | POA: Diagnosis not present

## 2019-07-05 HISTORY — DX: Encounter for general adult medical examination without abnormal findings: Z00.00

## 2019-07-05 NOTE — Progress Notes (Signed)
Chief Complaint  Patient presents with  . Follow-up   Annual doing well  1. Does c/o LLQ female pelvic pain at times with h/p pelvic congestion  2. IBS with diarrhea Stacey [name] is helping will continue and GERD protonix helping  3. C/o joint pain low back, hips, knees, hands, and red rash to b/l elbows with not much scale and red lesion to left lower leg has Tried ice/heat w/o help 5-6/10 at times  TMC 0.1 and puts and helps rash FH of both grandparents arthritis, parents arthritis, cousin on disability for arthritis, juevinille arthritis and RA in family   Review of Systems  Constitutional: Negative for weight loss.  HENT: Negative for hearing loss.   Eyes: Negative for blurred vision.  Respiratory: Negative for shortness of breath.   Cardiovascular: Negative for chest pain.  Gastrointestinal: Positive for abdominal pain.  Musculoskeletal: Positive for back pain and joint pain.  Skin: Positive for rash. Negative for itching.  Neurological: Negative for headaches.  Psychiatric/Behavioral: Negative for depression and memory loss.   Past Medical History:  Diagnosis Date  . Bleeding from the nose    nose cauterized 1980s  . GERD (gastroesophageal reflux disease)   . IBS (irritable bowel syndrome)   . Kidney cysts   . Meniere disease    left hearing loss 2012   . Pelvic congestion syndrome    Past Surgical History:  Procedure Laterality Date  . ABDOMINAL HYSTERECTOMY     ovaries intact 2017 2/2 DUB  . ABLATION     DUB 10/29/04  . BREAST BIOPSY Right    benign  . BREAST SURGERY     bx in 2017/2018 neg dense breast; clip placement right breast, 2008 fibroadenoma breast bx  . CHOLECYSTECTOMY     2015  . DILATION AND CURETTAGE OF UTERUS    . LAPAROSCOPY ABDOMEN DIAGNOSTIC    . TONSILLECTOMY     1983  . TUBAL LIGATION    . WISDOM TOOTH EXTRACTION     1989   Family History  Problem Relation Age of Onset  . Arthritis Mother   . Hypertension Mother   . Arthritis Father   .  Hypertension Father   . Diabetes Father   . Hearing loss Father   . Hyperlipidemia Father   . Cancer Paternal Aunt        gu cancer ? type-endometrial  . Rheumatic fever Paternal Aunt   . Mental illness Paternal Aunt        cognitive impairment  . Arthritis Maternal Grandmother   . Diabetes Maternal Grandmother   . Hyperlipidemia Maternal Grandmother   . Hypertension Maternal Grandmother   . Arthritis Maternal Grandfather   . Hyperlipidemia Maternal Grandfather   . Hypertension Maternal Grandfather   . Arthritis Paternal Grandmother   . Diabetes Paternal Grandmother   . Hyperlipidemia Paternal Grandmother   . Hypertension Paternal Grandmother   . Arthritis Paternal Grandfather   . Hyperlipidemia Paternal Grandfather   . Hypertension Paternal Grandfather   . Arthritis Sister   . Miscarriages / Stillbirths Sister   . Deep vein thrombosis Sister    Social History   Socioeconomic History  . Marital status: Married    Spouse name: Not on file  . Number of children: Not on file  . Years of education: Not on file  . Highest education level: Not on file  Occupational History  . Not on file  Tobacco Use  . Smoking status: Never Smoker  . Smokeless tobacco: Never Used  Substance and Sexual Activity  . Alcohol use: Yes  . Drug use: Not Currently  . Sexual activity: Yes    Partners: Male  Other Topics Concern  . Not on file  Social History Narrative   Moved from [place] 2019    2 kids girl and boy born 1995 and 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       No guns    Wears seat belt    Safe in relationship    Never smoker    Social Determinants of Radio broadcast assistant Strain:   . Difficulty of Paying Living Expenses: Not on file  Food Insecurity:   . Worried About Charity fundraiser in the Last Year: Not on file  . Ran Out of Food in the Last Year: Not on file  Transportation Needs:   . Lack of Transportation  (Medical): Not on file  . Lack of Transportation (Non-Medical): Not on file  Physical Activity:   . Days of Exercise per Week: Not on file  . Minutes of Exercise per Session: Not on file  Stress:   . Feeling of Stress : Not on file  Social Connections:   . Frequency of Communication with Friends and Family: Not on file  . Frequency of Social Gatherings with Friends and Family: Not on file  . Attends Religious Services: Not on file  . Active Member of Clubs or Organizations: Not on file  . Attends Archivist Meetings: Not on file  . Marital Status: Not on file  Intimate Partner Violence:   . Fear of Current or Ex-Partner: Not on file  . Emotionally Abused: Not on file  . Physically Abused: Not on file  . Sexually Abused: Not on file   Current Meds  Medication Sig  . Ascorbic Acid (VITAMIN C PO) Take by mouth.  . B Complex Vitamins (B COMPLEX 1 PO) Take by mouth.  . calcium carbonate (OS-CAL) 600 MG tablet Take 600 mg by mouth daily.  . Cholecalciferol (CVS D3) 50 MCG (2000 UT) CAPS Take by mouth.  . cholestyramine (QUESTRAN) 4 g packet USE 1 PACKET MIXED AS DIRECTED DAILY  . Citicoline (COGNITIVE HEALTH PO) Take by mouth. cognium  . ECHINACEA EXTRACT PO Take 750 mg by mouth daily.  Marland Kitchen ELDERBERRY PO Take 50 mg by mouth daily.  Marland Kitchen FOLIC ACID PO Take by mouth.  . Ginger, Zingiber officinalis, (GINGER PO) Take by mouth.  Marland Kitchen GLUCOSAMINE HCL PO Take by mouth.  . Lysine 1000 MG TABS Take 1 tablet by mouth daily.  . pantoprazole (PROTONIX) 40 MG tablet Take 1 tablet (40 mg total) by mouth daily. 30 minutes before food  . Probiotic Product (PROBIOTIC PO) Take by mouth.  . Turmeric (QC TUMERIC COMPLEX PO) Take by mouth.  . zinc gluconate 50 MG tablet Take 50 mg by mouth daily.   Allergies  Allergen Reactions  . Other     Dairy and peanuts    No results found for this or any previous visit (from the past 2160 hour(s)). Objective  Body mass index is 18.12 kg/m. Wt Readings  from Last 3 Encounters:  07/05/19 119 lb 3.2 oz (54.1 kg)  09/21/18 115 lb (52.2 kg)  04/19/18 116 lb (52.6 kg)   Temp Readings from Last 3 Encounters:  07/05/19 (!) 96.9 F (36.1 C) (Temporal)  09/21/18 (!) 96.7 F (35.9 C)  04/19/18 97.9  F (36.6 C) (Oral)   BP Readings from Last 3 Encounters:  07/05/19 110/76  09/21/18 101/60  04/19/18 106/70   Pulse Readings from Last 3 Encounters:  07/05/19 72  09/21/18 61  04/19/18 87    Physical Exam Vitals and nursing note reviewed.  Constitutional:      Appearance: Normal appearance. She is well-developed and well-groomed.  HENT:     Head: Normocephalic and atraumatic.  Eyes:     Conjunctiva/sclera: Conjunctivae normal.     Pupils: Pupils are equal, round, and reactive to light.  Cardiovascular:     Rate and Rhythm: Normal rate and regular rhythm.     Heart sounds: Normal heart sounds. No murmur.  Pulmonary:     Effort: Pulmonary effort is normal.     Breath sounds: Normal breath sounds.  Chest:     Breasts: Breasts are asymmetrical.        Right: No swelling, bleeding, inverted nipple, mass, nipple discharge, skin change or tenderness.        Left: No swelling, bleeding, inverted nipple, mass, nipple discharge, skin change or tenderness.     Comments: Left breast larger than right  Right breast s/p bx at areaolar upper normal per pt  Blue hue right nipple Abdominal:     General: Abdomen is flat. Bowel sounds are normal.  Lymphadenopathy:     Upper Body:     Right upper body: No axillary adenopathy.     Left upper body: No axillary adenopathy.  Skin:    General: Skin is warm and dry.     Comments: ? Mild psa vs dermatitis b/l elbows  ? Dermatitis vs tinea LL leg   Neurological:     General: No focal deficit present.     Mental Status: She is alert and oriented to person, place, and time. Mental status is at baseline.  Psychiatric:        Mood and Affect: Mood normal.        Behavior: Behavior normal. Behavior is  cooperative.        Thought Content: Thought content normal.        Judgment: Judgment normal.     Assessment  Plan  Annual physical exam -  Plan: Comprehensive metabolic panel, Lipid panel, CBC with Differential/Platelet, TSH, T4, free, Urinalysis, Routine w reflex microscopic Flu shot utd  Disc Tdap todayno recent in old PCP notes MMR immune  rec hep B vaccine Consider shingrix age 69 y.o disc with pt today covid 19 x 2/2  Pap had 05/2017 get recordsobtained 05/20/15 normal neg HPV no h/o abnormal pap s/p hysterectomy with b/l ovaries intact   mammo 11/21/18 negative  -referred mammogram has right breast clip h/o breast calcifications, h/o fibroadenoma  -given phone # to call [facility] today  Right breast bx 03/09/2007 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 so due to size excised path fibrocystic changes fibrous mastopathy, columnar cell change w/o atypia focal sclerosis adenosis    Egd/colonoscopy had 2017 get records -per notes colonoscopy in 08/2006 and 12/2012 2/2 diarrhea + internal hemorrhoids bxs negative for colitis  -EGD/colonoscopy 09/02/06 mild distal esophagitis/mild gastritis, small hiatal hernia; no polyps or colitis -repeat colonoscopy due 12/2022   Skin 07/05/19 nl no need derm   Never smoker  HIV neg 09/29/16  rec healthy diet and exercise    Female pelvic pain - Plan: Ambulatory referral to Obstetrics / Gynecology Pelvic congestion - Plan: Ambulatory referral to Obstetrics / Gynecology  Arthralgia, unspecified joint -  Plan: Comprehensive metabolic panel, CBC with Differential/Platelet, Urinalysis, Routine w reflex microscopic, Rheumatoid Factor, Cyclic citrul peptide antibody, IgG (QUEST), Sedimentation rate, C-reactive protein, Antinuclear Antib (ANA)  Reviewed pt records from (Dr. Elsie Amis) [facility] in [place] see 1st note   Provider: Dr. Olivia Mackie [facility]

## 2019-07-05 NOTE — Patient Instructions (Addendum)
voltaren gel   Recombinant Zoster (Shingles) Vaccine: What You Need to Know 1. Why get vaccinated? Recombinant zoster (shingles) vaccine can prevent shingles. Shingles (also called herpes zoster, or just zoster) is a painful skin rash, usually with blisters. In addition to the rash, shingles can cause fever, headache, chills, or upset stomach. More rarely, shingles can lead to pneumonia, hearing problems, blindness, brain inflammation (encephalitis), or death. The most common complication of shingles is long-term nerve pain called postherpetic neuralgia (PHN). PHN occurs in the areas where the shingles rash was, even after the rash clears up. It can last for months or years after the rash goes away. The pain from PHN can be severe and debilitating. About 10 to 18% of people who get shingles will experience PHN. The risk of PHN increases with age. An older adult with shingles is more likely to develop PHN and have longer lasting and more severe pain than a younger person with shingles. Shingles is caused by the varicella zoster virus, the same virus that causes chickenpox. After you have chickenpox, the virus stays in your body and can cause shingles later in life. Shingles cannot be passed from one person to another, but the virus that causes shingles can spread and cause chickenpox in someone who had never had chickenpox or received chickenpox vaccine. 2. Recombinant shingles vaccine Recombinant shingles vaccine provides strong protection against shingles. By preventing shingles, recombinant shingles vaccine also protects against PHN. Recombinant shingles vaccine is the preferred vaccine for the prevention of shingles. However, a different vaccine, live shingles vaccine, may be used in some circumstances. The recombinant shingles vaccine is recommended for adults 50 years and older without serious immune problems. It is given as a two-dose series. This vaccine is also recommended for people who have  already gotten another type of shingles vaccine, the live shingles vaccine. There is no live virus in this vaccine. Shingles vaccine may be given at the same time as other vaccines. 3. Talk with your health care provider Tell your vaccine provider if the person getting the vaccine:  Has had an allergic reaction after a previous dose of recombinant shingles vaccine, or has any severe, life-threatening allergies.  Is pregnant or breastfeeding.  Is currently experiencing an episode of shingles. In some cases, your health care provider may decide to postpone shingles vaccination to a future visit. People with minor illnesses, such as a cold, may be vaccinated. People who are moderately or severely ill should usually wait until they recover before getting recombinant shingles vaccine. Your health care provider can give you more information. 4. Risks of a vaccine reaction  A sore arm with mild or moderate pain is very common after recombinant shingles vaccine, affecting about 80% of vaccinated people. Redness and swelling can also happen at the site of the injection.  Tiredness, muscle pain, headache, shivering, fever, stomach pain, and nausea happen after vaccination in more than half of people who receive recombinant shingles vaccine. In clinical trials, about 1 out of 6 people who got recombinant zoster vaccine experienced side effects that prevented them from doing regular activities. Symptoms usually went away on their own in 2 to 3 days. You should still get the second dose of recombinant zoster vaccine even if you had one of these reactions after the first dose. People sometimes faint after medical procedures, including vaccination. Tell your provider if you feel dizzy or have vision changes or ringing in the ears. As with any medicine, there is a very remote chance  of a vaccine causing a severe allergic reaction, other serious injury, or death. 5. What if there is a serious problem? An  allergic reaction could occur after the vaccinated person leaves the clinic. If you see signs of a severe allergic reaction (hives, swelling of the face and throat, difficulty breathing, a fast heartbeat, dizziness, or weakness), call 9-1-1 and get the person to the nearest hospital. For other signs that concern you, call your health care provider. Adverse reactions should be reported to the Vaccine Adverse Event Reporting System (VAERS). Your health care provider will usually file this report, or you can do it yourself. Visit the VAERS website at www.vaers.LAgents.no or call 206 859 4661. VAERS is only for reporting reactions, and VAERS staff do not give medical advice. 6. How can I learn more?  Ask your health care provider.  Call your local or state health department.  Contact the Centers for Disease Control and Prevention (CDC): ? Call 908-284-5448 (1-800-CDC-INFO) or ? Visit CDC's website at PicCapture.uy Vaccine Information Statement Recombinant Zoster Vaccine (03/09/2018) This information is not intended to replace advice given to you by your health care provider. Make sure you discuss any questions you have with your health care provider. Document Revised: 08/16/2018 Document Reviewed: 12/01/2017 Elsevier Patient Education  2020 [employer].

## 2019-07-10 DIAGNOSIS — U071 COVID-19: Secondary | ICD-10-CM | POA: Diagnosis not present

## 2019-07-10 DIAGNOSIS — Z20828 Contact with and (suspected) exposure to other viral communicable diseases: Secondary | ICD-10-CM | POA: Diagnosis not present

## 2019-07-12 DIAGNOSIS — R102 Pelvic and perineal pain: Secondary | ICD-10-CM | POA: Diagnosis not present

## 2019-07-12 DIAGNOSIS — N9489 Other specified conditions associated with female genital organs and menstrual cycle: Secondary | ICD-10-CM | POA: Diagnosis not present

## 2019-07-12 DIAGNOSIS — D696 Thrombocytopenia, unspecified: Secondary | ICD-10-CM | POA: Diagnosis not present

## 2019-07-14 ENCOUNTER — Other Ambulatory Visit: Payer: 59

## 2019-07-24 DIAGNOSIS — U071 COVID-19: Secondary | ICD-10-CM | POA: Diagnosis not present

## 2019-07-24 DIAGNOSIS — Z20828 Contact with and (suspected) exposure to other viral communicable diseases: Secondary | ICD-10-CM | POA: Diagnosis not present

## 2019-07-25 ENCOUNTER — Telehealth: Payer: Self-pay | Admitting: Internal Medicine

## 2019-07-25 ENCOUNTER — Other Ambulatory Visit: Payer: 59

## 2019-07-25 DIAGNOSIS — U071 COVID-19: Secondary | ICD-10-CM | POA: Diagnosis not present

## 2019-07-25 NOTE — Telephone Encounter (Signed)
Pt would like to have her lab orders sent over to [facility] she has an appt there on 3/18

## 2019-07-25 NOTE — Telephone Encounter (Signed)
Okay to change orders to [facility]?

## 2019-07-26 NOTE — Addendum Note (Signed)
Addended by: Quentin Ore on: 07/26/2019 02:19 PM   Modules accepted: Orders

## 2019-07-26 NOTE — Telephone Encounter (Signed)
[facility] orders in if she wants to pick up ok on the printer or can just go to [facility]  TMS

## 2019-07-26 NOTE — Telephone Encounter (Signed)
Patient informed and verbalized understanding.  She will come pick up orders placed upfront.

## 2019-07-27 DIAGNOSIS — Z1329 Encounter for screening for other suspected endocrine disorder: Secondary | ICD-10-CM | POA: Diagnosis not present

## 2019-07-27 DIAGNOSIS — Z0000 Encounter for general adult medical examination without abnormal findings: Secondary | ICD-10-CM | POA: Diagnosis not present

## 2019-07-27 DIAGNOSIS — Z1389 Encounter for screening for other disorder: Secondary | ICD-10-CM | POA: Diagnosis not present

## 2019-07-27 DIAGNOSIS — M2550 Pain in unspecified joint: Secondary | ICD-10-CM | POA: Diagnosis not present

## 2019-07-29 LAB — COMPREHENSIVE METABOLIC PANEL
ALT: 21 IU/L (ref 0–32)
AST: 23 IU/L (ref 0–40)
Albumin/Globulin Ratio: 2 (ref 1.2–2.2)
Albumin: 4.7 g/dL (ref 3.8–4.8)
Alkaline Phosphatase: 37 IU/L — ABNORMAL LOW (ref 39–117)
BUN/Creatinine Ratio: 14 (ref 9–23)
BUN: 13 mg/dL (ref 6–24)
Bilirubin Total: 0.6 mg/dL (ref 0.0–1.2)
CO2: 27 mmol/L (ref 20–29)
Calcium: 9.6 mg/dL (ref 8.7–10.2)
Chloride: 102 mmol/L (ref 96–106)
Creatinine, Ser: 0.96 mg/dL (ref 0.57–1.00)
GFR calc Af Amer: 80 mL/min/{1.73_m2} (ref >59)
GFR calc non Af Amer: 69 mL/min/{1.73_m2} (ref >59)
Globulin, Total: 2.3 g/dL (ref 1.5–4.5)
Glucose: 81 mg/dL (ref 65–99)
Potassium: 4.3 mmol/L (ref 3.5–5.2)
Sodium: 143 mmol/L (ref 134–144)
Total Protein: 7 g/dL (ref 6.0–8.5)

## 2019-07-29 LAB — TSH: TSH: 2.85 u[IU]/mL (ref 0.450–4.500)

## 2019-07-29 LAB — T4, FREE: Free T4: 1.14 ng/dL (ref 0.82–1.77)

## 2019-07-29 LAB — URINALYSIS, ROUTINE W REFLEX MICROSCOPIC
Bilirubin, UA: NEGATIVE
Glucose, UA: NEGATIVE
Ketones, UA: NEGATIVE
Leukocytes,UA: NEGATIVE
Nitrite, UA: NEGATIVE
Protein,UA: NEGATIVE
RBC, UA: NEGATIVE
Specific Gravity, UA: 1.018 (ref 1.005–1.030)
Urobilinogen, Ur: 0.2 mg/dL (ref 0.2–1.0)
pH, UA: 6.5 (ref 5.0–7.5)

## 2019-07-29 LAB — CBC WITH DIFFERENTIAL/PLATELET
Basophils Absolute: 0.1 10*3/uL (ref 0.0–0.2)
Basos: 1 %
EOS (ABSOLUTE): 0.1 10*3/uL (ref 0.0–0.4)
Eos: 3 %
Hematocrit: 42.6 % (ref 34.0–46.6)
Hemoglobin: 14.2 g/dL (ref 11.1–15.9)
Immature Grans (Abs): 0 10*3/uL (ref 0.0–0.1)
Immature Granulocytes: 0 %
Lymphocytes Absolute: 1.6 10*3/uL (ref 0.7–3.1)
Lymphs: 36 %
MCH: 31.2 pg (ref 26.6–33.0)
MCHC: 33.3 g/dL (ref 31.5–35.7)
MCV: 94 fL (ref 79–97)
Monocytes Absolute: 0.4 10*3/uL (ref 0.1–0.9)
Monocytes: 8 %
Neutrophils Absolute: 2.2 10*3/uL (ref 1.4–7.0)
Neutrophils: 52 %
Platelets: 185 10*3/uL (ref 150–450)
RBC: 4.55 x10E6/uL (ref 3.77–5.28)
RDW: 11.9 % (ref 11.7–15.4)
WBC: 4.3 10*3/uL (ref 3.4–10.8)

## 2019-07-29 LAB — LIPID PANEL
Chol/HDL Ratio: 2.3 ratio (ref 0.0–4.4)
Cholesterol, Total: 188 mg/dL (ref 100–199)
HDL: 83 mg/dL (ref >39)
LDL Chol Calc (NIH): 94 mg/dL (ref 0–99)
Triglycerides: 57 mg/dL (ref 0–149)
VLDL Cholesterol Cal: 11 mg/dL (ref 5–40)

## 2019-07-29 LAB — C-REACTIVE PROTEIN: CRP: <1 mg/L (ref 0–10)

## 2019-07-29 LAB — ANA: Anti Nuclear Antibody (ANA): NEGATIVE

## 2019-07-29 LAB — CYCLIC CITRUL PEPTIDE ANTIBODY, IGG/IGA: Cyclic Citrullin Peptide Ab: 4 units (ref 0–19)

## 2019-07-29 LAB — SEDIMENTATION RATE: Sed Rate: 2 mm/hr (ref 0–40)

## 2019-07-29 LAB — RHEUMATOID FACTOR: Rheumatoid fact SerPl-aCnc: <10 IU/mL (ref 0.0–13.9)

## 2019-07-31 DIAGNOSIS — Z20828 Contact with and (suspected) exposure to other viral communicable diseases: Secondary | ICD-10-CM | POA: Diagnosis not present

## 2019-07-31 DIAGNOSIS — U071 COVID-19: Secondary | ICD-10-CM | POA: Diagnosis not present

## 2019-08-08 ENCOUNTER — Encounter: Payer: Self-pay | Admitting: Internal Medicine

## 2019-08-14 ENCOUNTER — Encounter: Payer: Self-pay | Admitting: Internal Medicine

## 2019-08-14 ENCOUNTER — Other Ambulatory Visit: Payer: Self-pay

## 2019-08-14 DIAGNOSIS — K219 Gastro-esophageal reflux disease without esophagitis: Secondary | ICD-10-CM

## 2019-08-14 MED ORDER — PANTOPRAZOLE SODIUM 40 MG PO TBEC: 40.0000 mg | DELAYED_RELEASE_TABLET | Freq: Every day | ORAL | 3 refills | Status: DC

## 2019-08-21 DIAGNOSIS — U071 COVID-19: Secondary | ICD-10-CM | POA: Diagnosis not present

## 2019-08-21 DIAGNOSIS — Z20828 Contact with and (suspected) exposure to other viral communicable diseases: Secondary | ICD-10-CM | POA: Diagnosis not present

## 2019-08-22 DIAGNOSIS — U071 COVID-19: Secondary | ICD-10-CM | POA: Diagnosis not present

## 2019-08-28 DIAGNOSIS — U071 COVID-19: Secondary | ICD-10-CM | POA: Diagnosis not present

## 2019-08-28 DIAGNOSIS — Z20828 Contact with and (suspected) exposure to other viral communicable diseases: Secondary | ICD-10-CM | POA: Diagnosis not present

## 2019-08-29 DIAGNOSIS — Z20828 Contact with and (suspected) exposure to other viral communicable diseases: Secondary | ICD-10-CM | POA: Diagnosis not present

## 2019-08-29 DIAGNOSIS — U071 COVID-19: Secondary | ICD-10-CM | POA: Diagnosis not present

## 2019-09-04 DIAGNOSIS — Z20828 Contact with and (suspected) exposure to other viral communicable diseases: Secondary | ICD-10-CM | POA: Diagnosis not present

## 2019-09-04 DIAGNOSIS — U071 COVID-19: Secondary | ICD-10-CM | POA: Diagnosis not present

## 2019-09-11 DIAGNOSIS — U071 COVID-19: Secondary | ICD-10-CM | POA: Diagnosis not present

## 2019-09-11 DIAGNOSIS — Z20828 Contact with and (suspected) exposure to other viral communicable diseases: Secondary | ICD-10-CM | POA: Diagnosis not present

## 2019-09-18 DIAGNOSIS — Z20828 Contact with and (suspected) exposure to other viral communicable diseases: Secondary | ICD-10-CM | POA: Diagnosis not present

## 2019-09-18 DIAGNOSIS — U071 COVID-19: Secondary | ICD-10-CM | POA: Diagnosis not present

## 2019-09-25 DIAGNOSIS — U071 COVID-19: Secondary | ICD-10-CM | POA: Diagnosis not present

## 2019-09-25 DIAGNOSIS — Z20828 Contact with and (suspected) exposure to other viral communicable diseases: Secondary | ICD-10-CM | POA: Diagnosis not present

## 2019-10-04 DIAGNOSIS — U071 COVID-19: Secondary | ICD-10-CM | POA: Diagnosis not present

## 2019-10-04 DIAGNOSIS — Z20828 Contact with and (suspected) exposure to other viral communicable diseases: Secondary | ICD-10-CM | POA: Diagnosis not present

## 2019-10-16 DIAGNOSIS — U071 COVID-19: Secondary | ICD-10-CM | POA: Diagnosis not present

## 2019-10-16 DIAGNOSIS — Z20828 Contact with and (suspected) exposure to other viral communicable diseases: Secondary | ICD-10-CM | POA: Diagnosis not present

## 2019-10-23 DIAGNOSIS — Z20828 Contact with and (suspected) exposure to other viral communicable diseases: Secondary | ICD-10-CM | POA: Diagnosis not present

## 2019-10-23 DIAGNOSIS — U071 COVID-19: Secondary | ICD-10-CM | POA: Diagnosis not present

## 2019-11-22 ENCOUNTER — Ambulatory Visit
Admission: RE | Admit: 2019-11-22 | Discharge: 2019-11-22 | Disposition: A | Discharge status: Home / Self Care | Payer: 59 | Source: Ambulatory Visit | Attending: Internal Medicine | Admitting: Internal Medicine

## 2019-11-22 DIAGNOSIS — Z1231 Encounter for screening mammogram for malignant neoplasm of breast: Secondary | ICD-10-CM | POA: Insufficient documentation

## 2019-12-13 ENCOUNTER — Other Ambulatory Visit: Payer: Self-pay | Admitting: Internal Medicine

## 2019-12-13 DIAGNOSIS — K580 Irritable bowel syndrome with diarrhea: Secondary | ICD-10-CM

## 2019-12-13 DIAGNOSIS — Z9049 Acquired absence of other specified parts of digestive tract: Secondary | ICD-10-CM

## 2019-12-13 MED ORDER — CHOLESTYRAMINE 4 G PO PACK: PACK | ORAL | 3 refills | Status: DC

## 2020-01-29 ENCOUNTER — Encounter: Payer: Self-pay | Admitting: Internal Medicine

## 2020-02-09 ENCOUNTER — Ambulatory Visit (INDEPENDENT_AMBULATORY_CARE_PROVIDER_SITE_OTHER): Payer: 59 | Admitting: Internal Medicine

## 2020-02-09 ENCOUNTER — Other Ambulatory Visit: Payer: Self-pay

## 2020-02-09 ENCOUNTER — Encounter: Payer: Self-pay | Admitting: Internal Medicine

## 2020-02-09 ENCOUNTER — Ambulatory Visit (INDEPENDENT_AMBULATORY_CARE_PROVIDER_SITE_OTHER): Payer: 59

## 2020-02-09 VITALS — BP 130/70 | HR 67 | Temp 97.8°F | Ht 68.0 in | Wt 118.8 lb

## 2020-02-09 DIAGNOSIS — M25551 Pain in right hip: Secondary | ICD-10-CM

## 2020-02-09 DIAGNOSIS — M5442 Lumbago with sciatica, left side: Secondary | ICD-10-CM

## 2020-02-09 DIAGNOSIS — M25511 Pain in right shoulder: Secondary | ICD-10-CM

## 2020-02-09 DIAGNOSIS — G8929 Other chronic pain: Secondary | ICD-10-CM

## 2020-02-09 DIAGNOSIS — M25552 Pain in left hip: Secondary | ICD-10-CM | POA: Diagnosis not present

## 2020-02-09 DIAGNOSIS — M79671 Pain in right foot: Secondary | ICD-10-CM | POA: Diagnosis not present

## 2020-02-09 DIAGNOSIS — M25512 Pain in left shoulder: Secondary | ICD-10-CM

## 2020-02-09 DIAGNOSIS — R102 Pelvic and perineal pain: Secondary | ICD-10-CM

## 2020-02-09 DIAGNOSIS — M25562 Pain in left knee: Secondary | ICD-10-CM

## 2020-02-09 DIAGNOSIS — K580 Irritable bowel syndrome with diarrhea: Secondary | ICD-10-CM | POA: Diagnosis not present

## 2020-02-09 DIAGNOSIS — Z9049 Acquired absence of other specified parts of digestive tract: Secondary | ICD-10-CM | POA: Diagnosis not present

## 2020-02-09 DIAGNOSIS — M5441 Lumbago with sciatica, right side: Secondary | ICD-10-CM

## 2020-02-09 DIAGNOSIS — R040 Epistaxis: Secondary | ICD-10-CM | POA: Diagnosis not present

## 2020-02-09 DIAGNOSIS — L409 Psoriasis, unspecified: Secondary | ICD-10-CM

## 2020-02-09 DIAGNOSIS — Z1283 Encounter for screening for malignant neoplasm of skin: Secondary | ICD-10-CM

## 2020-02-09 DIAGNOSIS — M47816 Spondylosis without myelopathy or radiculopathy, lumbar region: Secondary | ICD-10-CM | POA: Diagnosis not present

## 2020-02-09 DIAGNOSIS — M25561 Pain in right knee: Secondary | ICD-10-CM | POA: Diagnosis not present

## 2020-02-09 MED ORDER — CHOLESTYRAMINE 4 G PO PACK: PACK | ORAL | 3 refills | Status: DC

## 2020-02-09 MED ORDER — CLOBETASOL PROPIONATE 0.05 % EX CREA: 1.0000 "application " | TOPICAL_CREAM | Freq: Two times a day (BID) | CUTANEOUS | 2 refills | Status: DC

## 2020-02-09 MED ORDER — MELOXICAM 7.5 MG PO TABS: 7.5000 mg | ORAL_TABLET | Freq: Every day | ORAL | 5 refills | Status: DC

## 2020-02-09 MED ORDER — CYCLOBENZAPRINE HCL 5 MG PO TABS: 5.0000 mg | ORAL_TABLET | Freq: Every evening | ORAL | 5 refills | Status: DC | PRN

## 2020-02-09 NOTE — Patient Instructions (Addendum)
voltaren gel 4x/day   Flu, Tdap and covid shot space out by 1 month    Nosebleed, Adult A nosebleed is when blood comes out of the nose. Nosebleeds are common. Usually, they are not a sign of a serious condition. Nosebleeds can happen if a small blood vessel in your nose starts to bleed or if the lining of your nose (mucous membrane) cracks. They are commonly caused by:  Allergies.  Colds.  Picking your nose.  Blowing your nose too hard.  An injury from sticking an object into your nose or getting hit in the nose.  Dry or cold air. Less common causes of nosebleeds include:  Toxic fumes.  Something abnormal in the nose or in the air-filled spaces in the bones of the face (sinuses).  Growths in the nose, such as polyps.  Medicines or conditions that cause blood to clot slowly.  Certain illnesses or procedures that irritate or dry out the nasal passages. Follow these instructions at home: When you have a nosebleed:   Sit down and tilt your head slightly forward.  Use a clean towel or tissue to pinch your nostrils under the bony part of your nose. After 10 minutes, let go of your nose and see if bleeding starts again. Do not release pressure before that time. If there is still bleeding, repeat the pinching and holding for 10 minutes until the bleeding stops.  Do not place tissues or gauze in the nose to stop bleeding.  Avoid lying down and avoid tilting your head backward. That may make blood collect in the throat and cause gagging or coughing.  Use a nasal spray decongestant to help with a nosebleed as told by your health care provider.  Do not use petroleum jelly or mineral oil in your nose. It can drip into your lungs. After a nosebleed:  Avoid blowing your nose or sniffing for a number of hours.  Avoid straining, lifting, or bending at the waist for several days. You may resume other normal activities as you are able.  Use saline spray or a humidifier as told by  your health care provider.  Aspirinand blood thinners make bleeding more likely. If you are prescribed these medicines and you suffer from nosebleeds: ? Ask your health care provider if you should stop taking the medicines or if you should adjust the dose. ? Do not stop taking medicines that your health care provider has recommended unless told by your health care provider.  If your nosebleed was caused by dry mucous membranes, use over-the-counter saline nasal spray or gel. This will keep the mucous membranes moist and allow them to heal. If you must use a lubricant: ? Choose one that is water-soluble. ? Use only as much as you need and use it only as often as needed. ? Do not lie down until several hours after you use it. Contact a health care provider if:  You have a fever.  You get nosebleeds often or more often than usual.  You bruise very easily.  You have a nosebleed from having something stuck in your nose.  You have bleeding in your mouth.  You vomit or cough up brown material.  You have a nosebleed after you start a new medicine. Get help right away if:  You have a nosebleed after a fall or a head injury.  Your nosebleed does not go away after 20 minutes.  You feel dizzy or weak.  You have unusual bleeding from other parts of your  body.  You have unusual bruising on other parts of your body.  You become sweaty.  You vomit blood. This information is not intended to replace advice given to you by your health care provider. Make sure you discuss any questions you have with your health care provider. Document Revised: 07/27/2017 Document Reviewed: 11/12/2015 [employer] Patient Education  2020 [employer] Plantar Fasciitis Rehab Ask your health care provider which exercises are safe for you. Do exercises exactly as told by your health care provider and adjust them as directed. It is normal to feel mild stretching, pulling, tightness, or discomfort as you do these  exercises. Stop right away if you feel sudden pain or your pain gets worse. Do not begin these exercises until told by your health care provider. Stretching and range-of-motion exercises These exercises warm up your muscles and joints and improve the movement and flexibility of your foot. These exercises also help to relieve pain. Plantar fascia stretch  1. Sit with your left / right leg crossed over your opposite knee. 2. Hold your heel with one hand with that thumb near your arch. With your other hand, hold your toes and gently pull them back toward the top of your foot. You should feel a stretch on the bottom of your toes or your foot (plantar fascia) or both. 3. Hold this stretch for__________ seconds. 4. Slowly release your toes and return to the starting position. Repeat __________ times. Complete this exercise __________ times a day. Gastrocnemius stretch, standing This exercise is also called a calf (gastroc) stretch. It stretches the muscles in the back of the upper calf. 1. Stand with your hands against a wall. 2. Extend your left / right leg behind you, and bend your front knee slightly. 3. Keeping your heels on the floor and your back knee straight, shift your weight toward the wall. Do not arch your back. You should feel a gentle stretch in your upper left / right calf. 4. Hold this position for __________ seconds. Repeat __________ times. Complete this exercise __________ times a day. Soleus stretch, standing This exercise is also called a calf (soleus) stretch. It stretches the muscles in the back of the lower calf. 1. Stand with your hands against a wall. 2. Extend your left / right leg behind you, and bend your front knee slightly. 3. Keeping your heels on the floor, bend your back knee and shift your weight slightly over your back leg. You should feel a gentle stretch deep in your lower calf. 4. Hold this position for __________ seconds. Repeat __________ times. Complete this  exercise __________ times a day. Gastroc and soleus stretch, standing step This exercise stretches the muscles in the back of the lower leg. These muscles are in the upper calf (gastrocnemius) and the lower calf (soleus). 1. Stand with the ball of your left / right foot on a step. The ball of your foot is on the walking surface, right under your toes. 2. Keep your other foot firmly on the same step. 3. Hold on to the wall or a railing for balance. 4. Slowly lift your other foot, allowing your body weight to press your left / right heel down over the edge of the step. You should feel a stretch in your left / right calf. 5. Hold this position for __________ seconds. 6. Return both feet to the step. 7. Repeat this exercise with a slight bend in your left / right knee. Repeat __________ times with your left / right knee  straight and __________ times with your left / right knee bent. Complete this exercise __________ times a day. Balance exercise This exercise builds your balance and strength control of your arch to help take pressure off your plantar fascia. Single leg stand If this exercise is too easy, you can try it with your eyes closed or while standing on a pillow. 1. Without shoes, stand near a railing or in a doorway. You may hold on to the railing or door frame as needed. 2. Stand on your left / right foot. Keep your big toe down on the floor and try to keep your arch lifted. Do not let your foot roll inward. 3. Hold this position for __________ seconds. Repeat __________ times. Complete this exercise __________ times a day. This information is not intended to replace advice given to you by your health care provider. Make sure you discuss any questions you have with your health care provider. Document Revised: 08/18/2018 Document Reviewed: 02/23/2018 [employer] Patient Education  2020 [employer]  Hip Exercises Ask your health care provider which exercises are safe for you. Do exercises  exactly as told by your health care provider and adjust them as directed. It is normal to feel mild stretching, pulling, tightness, or discomfort as you do these exercises. Stop right away if you feel sudden pain or your pain gets worse. Do not begin these exercises until told by your health care provider. Stretching and range-of-motion exercises These exercises warm up your muscles and joints and improve the movement and flexibility of your hip. These exercises also help to relieve pain, numbness, and tingling. You may be asked to limit your range of motion if you had a hip replacement. Talk to your health care provider about these restrictions. Hamstrings, supine  1. Lie on your back (supine position). 2. Loop a belt or towel over the ball of your left / right foot. The ball of your foot is on the walking surface, right under your toes. 3. Straighten your left / right knee and slowly pull on the belt or towel to raise your leg until you feel a gentle stretch behind your knee (hamstring). ? Do not let your knee bend while you do this. ? Keep your other leg flat on the floor. 4. Hold this position for __________ seconds. 5. Slowly return your leg to the starting position. Repeat __________ times. Complete this exercise __________ times a day. Hip rotation  1. Lie on your back on a firm surface. 2. With your left / right hand, gently pull your left / right knee toward the shoulder that is on the same side of the body. Stop when your knee is pointing toward the ceiling. 3. Hold your left / right ankle with your other hand. 4. Keeping your knee steady, gently pull your left / right ankle toward your other shoulder until you feel a stretch in your buttocks. ? Keep your hips and shoulders firmly planted while you do this stretch. 5. Hold this position for __________ seconds. Repeat __________ times. Complete this exercise __________ times a day. Seated stretch This exercise is sometimes called  hamstrings and adductors stretch. 1. Sit on the floor with your legs stretched wide. Keep your knees straight during this exercise. 2. Keeping your head and back in a straight line, bend at your waist to reach for your left foot (position A). You should feel a stretch in your right inner thigh (adductors). 3. Hold this position for __________ seconds. Then slowly return to  the upright position. 4. Keeping your head and back in a straight line, bend at your waist to reach forward (position B). You should feel a stretch behind both of your thighs and knees (hamstrings). 5. Hold this position for __________ seconds. Then slowly return to the upright position. 6. Keeping your head and back in a straight line, bend at your waist to reach for your right foot (position C). You should feel a stretch in your left inner thigh (adductors). 7. Hold this position for __________ seconds. Then slowly return to the upright position. Repeat __________ times. Complete this exercise __________ times a day. Lunge This exercise stretches the muscles of the hip (hip flexors). 1. Place your left / right knee on the floor and bend your other knee so that is directly over your ankle. You should be half-kneeling. 2. Keep good posture with your head over your shoulders. 3. Tighten your buttocks to point your tailbone downward. This will prevent your back from arching too much. 4. You should feel a gentle stretch in the front of your left / right thigh and hip. If you do not feel a stretch, slide your other foot forward slightly and then slowly lunge forward with your chest up until your knee once again lines up over your ankle. ? Make sure your tailbone continues to point downward. 5. Hold this position for __________ seconds. 6. Slowly return to the starting position. Repeat __________ times. Complete this exercise __________ times a day. Strengthening exercises These exercises build strength and endurance in your hip.  Endurance is the ability to use your muscles for a long time, even after they get tired. Bridge This exercise strengthens the muscles of your hip (hip extensors). 1. Lie on your back on a firm surface with your knees bent and your feet flat on the floor. 2. Tighten your buttocks muscles and lift your bottom off the floor until the trunk of your body and your hips are level with your thighs. ? Do not arch your back. ? You should feel the muscles working in your buttocks and the back of your thighs. If you do not feel these muscles, slide your feet 1-2 inches (2.5-5 cm) farther away from your buttocks. 3. Hold this position for __________ seconds. 4. Slowly lower your hips to the starting position. 5. Let your muscles relax completely between repetitions. Repeat __________ times. Complete this exercise __________ times a day. Straight leg raises, side-lying This exercise strengthens the muscles that move the hip joint away from the center of the body (hip abductors). 1. Lie on your side with your left / right leg in the top position. Lie so your head, shoulder, hip, and knee line up. You may bend your bottom knee slightly to help you balance. 2. Roll your hips slightly forward, so your hips are stacked directly over each other and your left / right knee is facing forward. 3. Leading with your heel, lift your top leg 4-6 inches (10-15 cm). You should feel the muscles in your top hip lifting. ? Do not let your foot drift forward. ? Do not let your knee roll toward the ceiling. 4. Hold this position for __________ seconds. 5. Slowly return to the starting position. 6. Let your muscles relax completely between repetitions. Repeat __________ times. Complete this exercise __________ times a day. Straight leg raises, side-lying This exercise strengthens the muscles that move the hip joint toward the center of the body (hip adductors). 1. Lie on your side with your  left / right leg in the bottom  position. Lie so your head, shoulder, hip, and knee line up. You may place your upper foot in front to help you balance. 2. Roll your hips slightly forward, so your hips are stacked directly over each other and your left / right knee is facing forward. 3. Tense the muscles in your inner thigh and lift your bottom leg 4-6 inches (10-15 cm). 4. Hold this position for __________ seconds. 5. Slowly return to the starting position. 6. Let your muscles relax completely between repetitions. Repeat __________ times. Complete this exercise __________ times a day. Straight leg raises, supine This exercise strengthens the muscles in the front of your thigh (quadriceps). 1. Lie on your back (supine position) with your left / right leg extended and your other knee bent. 2. Tense the muscles in the front of your left / right thigh. You should see your kneecap slide up or see increased dimpling just above your knee. 3. Keep these muscles tight as you raise your leg 4-6 inches (10-15 cm) off the floor. Do not let your knee bend. 4. Hold this position for __________ seconds. 5. Keep these muscles tense as you lower your leg. 6. Relax the muscles slowly and completely between repetitions. Repeat __________ times. Complete this exercise __________ times a day. Hip abductors, standing This exercise strengthens the muscles that move the leg and hip joint away from the center of the body (hip abductors). 1. Tie one end of a rubber exercise band or tubing to a secure surface, such as a chair, table, or pole. 2. Loop the other end of the band or tubing around your left / right ankle. 3. Keeping your ankle with the band or tubing directly opposite the secured end, step away until there is tension in the tubing or band. Hold on to a chair, table, or pole as needed for balance. 4. Lift your left / right leg out to your side. While you do this: ? Keep your back upright. ? Keep your shoulders over your hips. ? Keep your  toes pointing forward. ? Make sure to use your hip muscles to slowly lift your leg. Do not tip your body or forcefully lift your leg. 5. Hold this position for __________ seconds. 6. Slowly return to the starting position. Repeat __________ times. Complete this exercise __________ times a day. Squats This exercise strengthens the muscles in the front of your thigh (quadriceps). 1. Stand in a door frame so your feet and knees are in line with the frame. You may place your hands on the frame for balance. 2. Slowly bend your knees and lower your hips like you are going to sit in a chair. ? Keep your lower legs in a straight-up-and-down position. ? Do not let your hips go lower than your knees. ? Do not bend your knees lower than told by your health care provider. ? If your hip pain increases, do not bend as low. 3. Hold this position for ___________ seconds. 4. Slowly push with your legs to return to standing. Do not use your hands to pull yourself to standing. Repeat __________ times. Complete this exercise __________ times a day. This information is not intended to replace advice given to you by your health care provider. Make sure you discuss any questions you have with your health care provider. Document Revised: 12/01/2018 Document Reviewed: 03/08/2018 [employer] Patient Education  2020 [employer]  Low Back Sprain or Strain Rehab Ask your health care provider  which exercises are safe for you. Do exercises exactly as told by your health care provider and adjust them as directed. It is normal to feel mild stretching, pulling, tightness, or discomfort as you do these exercises. Stop right away if you feel sudden pain or your pain gets worse. Do not begin these exercises until told by your health care provider. Stretching and range-of-motion exercises These exercises warm up your muscles and joints and improve the movement and flexibility of your back. These exercises also help to relieve  pain, numbness, and tingling. Lumbar rotation  6. Lie on your back on a firm surface and bend your knees. 7. Straighten your arms out to your sides so each arm forms a 90-degree angle (right angle) with a side of your body. 8. Slowly move (rotate) both of your knees to one side of your body until you feel a stretch in your lower back (lumbar). Try not to let your shoulders lift off the floor. 9. Hold this position for __________ seconds. 10. Tense your abdominal muscles and slowly move your knees back to the starting position. 11. Repeat this exercise on the other side of your body. Repeat __________ times. Complete this exercise __________ times a day. Single knee to chest  6. Lie on your back on a firm surface with both legs straight. 7. Bend one of your knees. Use your hands to move your knee up toward your chest until you feel a gentle stretch in your lower back and buttock. ? Hold your leg in this position by holding on to the front of your knee. ? Keep your other leg as straight as possible. 8. Hold this position for __________ seconds. 9. Slowly return to the starting position. 10. Repeat with your other leg. Repeat __________ times. Complete this exercise __________ times a day. Prone extension on elbows  8. Lie on your abdomen on a firm surface (prone position). 9. Prop yourself up on your elbows. 10. Use your arms to help lift your chest up until you feel a gentle stretch in your abdomen and your lower back. ? This will place some of your body weight on your elbows. If this is uncomfortable, try stacking pillows under your chest. ? Your hips should stay down, against the surface that you are lying on. Keep your hip and back muscles relaxed. 11. Hold this position for __________ seconds. 12. Slowly relax your upper body and return to the starting position. Repeat __________ times. Complete this exercise __________ times a day. Strengthening exercises These exercises build  strength and endurance in your back. Endurance is the ability to use your muscles for a long time, even after they get tired. Pelvic tilt This exercise strengthens the muscles that lie deep in the abdomen. 7. Lie on your back on a firm surface. Bend your knees and keep your feet flat on the floor. 8. Tense your abdominal muscles. Tip your pelvis up toward the ceiling and flatten your lower back into the floor. ? To help with this exercise, you may place a small towel under your lower back and try to push your back into the towel. 9. Hold this position for __________ seconds. 10. Let your muscles relax completely before you repeat this exercise. Repeat __________ times. Complete this exercise __________ times a day. Alternating arm and leg raises  6. Get on your hands and knees on a firm surface. If you are on a hard floor, you may want to use padding, such as an exercise mat,  to cushion your knees. 7. Line up your arms and legs. Your hands should be directly below your shoulders, and your knees should be directly below your hips. 8. Lift your left leg behind you. At the same time, raise your right arm and straighten it in front of you. ? Do not lift your leg higher than your hip. ? Do not lift your arm higher than your shoulder. ? Keep your abdominal and back muscles tight. ? Keep your hips facing the ground. ? Do not arch your back. ? Keep your balance carefully, and do not hold your breath. 9. Hold this position for __________ seconds. 10. Slowly return to the starting position. 11. Repeat with your right leg and your left arm. Repeat __________ times. Complete this exercise __________ times a day. Abdominal set with straight leg raise  7. Lie on your back on a firm surface. 8. Bend one of your knees and keep your other leg straight. 9. Tense your abdominal muscles and lift your straight leg up, 4-6 inches (10-15 cm) off the ground. 10. Keep your abdominal muscles tight and hold this  position for __________ seconds. ? Do not hold your breath. ? Do not arch your back. Keep it flat against the ground. 11. Keep your abdominal muscles tense as you slowly lower your leg back to the starting position. 12. Repeat with your other leg. Repeat __________ times. Complete this exercise __________ times a day. Single leg lower with bent knees 7. Lie on your back on a firm surface. 8. Tense your abdominal muscles and lift your feet off the floor, one foot at a time, so your knees and hips are bent in 90-degree angles (right angles). ? Your knees should be over your hips and your lower legs should be parallel to the floor. 9. Keeping your abdominal muscles tense and your knee bent, slowly lower one of your legs so your toe touches the ground. 10. Lift your leg back up to return to the starting position. ? Do not hold your breath. ? Do not let your back arch. Keep your back flat against the ground. 11. Repeat with your other leg. Repeat __________ times. Complete this exercise __________ times a day. Posture and body mechanics Good posture and healthy body mechanics can help to relieve stress in your body's tissues and joints. Body mechanics refers to the movements and positions of your body while you do your daily activities. Posture is part of body mechanics. Good posture means:  Your spine is in its natural S-curve position (neutral).  Your shoulders are pulled back slightly.  Your head is not tipped forward. Follow these guidelines to improve your posture and body mechanics in your everyday activities. Standing   When standing, keep your spine neutral and your feet about hip width apart. Keep a slight bend in your knees. Your ears, shoulders, and hips should line up.  When you do a task in which you stand in one place for a long time, place one foot up on a stable object that is 2-4 inches (5-10 cm) high, such as a footstool. This helps keep your spine  neutral. Sitting   When sitting, keep your spine neutral and keep your feet flat on the floor. Use a footrest, if necessary, and keep your thighs parallel to the floor. Avoid rounding your shoulders, and avoid tilting your head forward.  When working at a desk or a computer, keep your desk at a height where your hands are slightly lower than  your elbows. Slide your chair under your desk so you are close enough to maintain good posture.  When working at a computer, place your monitor at a height where you are looking straight ahead and you do not have to tilt your head forward or downward to look at the screen. Resting  When lying down and resting, avoid positions that are most painful for you.  If you have pain with activities such as sitting, bending, stooping, or squatting, lie in a position in which your body does not bend very much. For example, avoid curling up on your side with your arms and knees near your chest (fetal position).  If you have pain with activities such as standing for a long time or reaching with your arms, lie with your spine in a neutral position and bend your knees slightly. Try the following positions: ? Lying on your side with a pillow between your knees. ? Lying on your back with a pillow under your knees. Lifting   When lifting objects, keep your feet at least shoulder width apart and tighten your abdominal muscles.  Bend your knees and hips and keep your spine neutral. It is important to lift using the strength of your legs, not your back. Do not lock your knees straight out.  Always ask for help to lift heavy or awkward objects. This information is not intended to replace advice given to you by your health care provider. Make sure you discuss any questions you have with your health care provider. Document Revised: 08/19/2018 Document Reviewed: 05/19/2018 Else[employer] Patient Education  2020 [employer]  Back Exercises The following exercises strengthen  the muscles that help to support the trunk and back. They also help to keep the lower back flexible. Doing these exercises can help to prevent back pain or lessen existing pain.  If you have back pain or discomfort, try doing these exercises 2-3 times each day or as told by your health care provider.  As your pain improves, do them once each day, but increase the number of times that you repeat the steps for each exercise (do more repetitions).  To prevent the recurrence of back pain, continue to do these exercises once each day or as told by your health care provider. Do exercises exactly as told by your health care provider and adjust them as directed. It is normal to feel mild stretching, pulling, tightness, or discomfort as you do these exercises, but you should stop right away if you feel sudden pain or your pain gets worse. Exercises Single knee to chest Repeat these steps 3-5 times for each leg: 1. Lie on your back on a firm bed or the floor with your legs extended. 2. Bring one knee to your chest. Your other leg should stay extended and in contact with the floor. 3. Hold your knee in place by grabbing your knee or thigh with both hands and hold. 4. Pull on your knee until you feel a gentle stretch in your lower back or buttocks. 5. Hold the stretch for 10-30 seconds. 6. Slowly release and straighten your leg. Pelvic tilt Repeat these steps 5-10 times: 1. Lie on your back on a firm bed or the floor with your legs extended. 2. Bend your knees so they are pointing toward the ceiling and your feet are flat on the floor. 3. Tighten your lower abdominal muscles to press your lower back against the floor. This motion will tilt your pelvis so your tailbone points up toward  the ceiling instead of pointing to your feet or the floor. 4. With gentle tension and even breathing, hold this position for 5-10 seconds. Cat-cow Repeat these steps until your lower back becomes more flexible: 1. Get into  a hands-and-knees position on a firm surface. Keep your hands under your shoulders, and keep your knees under your hips. You may place padding under your knees for comfort. 2. Let your head hang down toward your chest. Contract your abdominal muscles and point your tailbone toward the floor so your lower back becomes rounded like the back of a cat. 3. Hold this position for 5 seconds. 4. Slowly lift your head, let your abdominal muscles relax and point your tailbone up toward the ceiling so your back forms a sagging arch like the back of a cow. 5. Hold this position for 5 seconds.  Press-ups Repeat these steps 5-10 times: 1. Lie on your abdomen (face-down) on the floor. 2. Place your palms near your head, about shoulder-width apart. 3. Keeping your back as relaxed as possible and keeping your hips on the floor, slowly straighten your arms to raise the top half of your body and lift your shoulders. Do not use your back muscles to raise your upper torso. You may adjust the placement of your hands to make yourself more comfortable. 4. Hold this position for 5 seconds while you keep your back relaxed. 5. Slowly return to lying flat on the floor.  Bridges Repeat these steps 10 times: 1. Lie on your back on a firm surface. 2. Bend your knees so they are pointing toward the ceiling and your feet are flat on the floor. Your arms should be flat at your sides, next to your body. 3. Tighten your buttocks muscles and lift your buttocks off the floor until your waist is at almost the same height as your knees. You should feel the muscles working in your buttocks and the back of your thighs. If you do not feel these muscles, slide your feet 1-2 inches farther away from your buttocks. 4. Hold this position for 3-5 seconds. 5. Slowly lower your hips to the starting position, and allow your buttocks muscles to relax completely. If this exercise is too easy, try doing it with your arms crossed over your  chest. Abdominal crunches Repeat these steps 5-10 times: 1. Lie on your back on a firm bed or the floor with your legs extended. 2. Bend your knees so they are pointing toward the ceiling and your feet are flat on the floor. 3. Cross your arms over your chest. 4. Tip your chin slightly toward your chest without bending your neck. 5. Tighten your abdominal muscles and slowly raise your trunk (torso) high enough to lift your shoulder blades a tiny bit off the floor. Avoid raising your torso higher than that because it can put too much stress on your low back and does not help to strengthen your abdominal muscles. 6. Slowly return to your starting position. Back lifts Repeat these steps 5-10 times: 1. Lie on your abdomen (face-down) with your arms at your sides, and rest your forehead on the floor. 2. Tighten the muscles in your legs and your buttocks. 3. Slowly lift your chest off the floor while you keep your hips pressed to the floor. Keep the back of your head in line with the curve in your back. Your eyes should be looking at the floor. 4. Hold this position for 3-5 seconds. 5. Slowly return to your starting position.  Contact a health care provider if:  Your back pain or discomfort gets much worse when you do an exercise.  Your worsening back pain or discomfort does not lessen within 2 hours after you exercise. If you have any of these problems, stop doing these exercises right away. Do not do them again unless your health care provider says that you can. Get help right away if:  You develop sudden, severe back pain. If this happens, stop doing the exercises right away. Do not do them again unless your health care provider says that you can. This information is not intended to replace advice given to you by your health care provider. Make sure you discuss any questions you have with your health care provider. Document Revised: 09/01/2018 Document Reviewed: 01/27/2018 [employer] Patient  Education  2020 [employer]    Psoriasis Psoriasis is a long-term (chronic) skin condition. It occurs because your immune system causes skin cells to form too quickly. As a result, too many skin cells grow and create raised, red patches (plaques) that often look silvery on your skin. Plaques may show up anywhere on your body. They can be any size or shape. Symptoms of this condition range from mild to very severe. Psoriasis cannot be passed from one person to another (is not contagious). Sometimes, the symptoms go away and then come back again. What are the causes? The cause of psoriasis is not known, but certain factors can make the condition worse. These include:  Damage or trauma to the skin, such as cuts, scrapes, sunburn, and dryness.  Not enough exposure to sunlight.  Certain medicines.  Alcohol.  Tobacco use.  Stress.  Infections caused by bacteria or viruses. What increases the risk? You are more likely to develop this condition if you:  Have a family history of psoriasis.  Are obese.  Are 28-52 years old.  Are taking certain medicines. What are the signs or symptoms? There are different types of psoriasis. You can have more than one type of psoriasis during your life. The types are:  Plaque. This is the most common.  Guttate. This is also called eruptive psoriasis.  Inverse.  Pustular.  Erythrodermic.  Sebopsoriasis.  Psoriatic arthritis. Each type of psoriasis has different symptoms.  Plaque psoriasis symptoms include red, raised plaques with a silvery-white coating (scale). These plaques may be itchy. Your nails may be pitted and crumbly or fall off.  Guttate psoriasis symptoms include small red spots that often show up on your trunk, arms, and legs. These spots may develop after you have been sick, especially with strep throat.  Inverse psoriasis symptoms include plaques in your underarm area, under your breasts, or on your genitals, groin, or  buttocks.  Pustular psoriasis symptoms include pus-filled bumps that are painful, red, and swollen on the palms of your hands or the soles of your feet. You also may feel exhausted, feverish, weak, or have no appetite.  Erythrodermic psoriasis symptoms include bright red skin that may look burned. You may have a fast heartbeat and a body temperature that is too high or too low. You may be itchy or in pain.  Sebopsoriasis symptoms include red plaques that have a greasy coating, and are often on your scalp, forehead, and face.  Psoriatic arthritis causes swollen, painful joints along with scaly skin plaques. How is this diagnosed? This condition is diagnosed based on your symptoms, family history, and a physical exam.  You may also be referred to a health care provider who specializes  in skin diseases (dermatologist).  Your health care provider may remove a tissue sample (biopsy) for testing. How is this treated? There is no cure for this condition, but treatment can help manage it. Goals of treatment include:  Helping your skin heal.  Reducing itching and inflammation.  Slowing the growth of new skin cells.  Helping your immune system respond better to your skin. Treatment varies, depending on the severity of your condition. This condition may be treated by:  Creams or ointments to help with symptoms.  Ultraviolet ray exposure (light therapy or phototherapy). This may include natural sunlight or light therapy in a medical office.  Medicines (systemic therapy). These medicines can help your body better manage skin cell turnover and inflammation. Medicines may be given in the form of pills or injections. They may be used along with light therapy or ointments. You may also get antibiotic medicines if you have an infection. Follow these instructions at home: Skin Care  Moisturize your skin as needed. Only use moisturizers that have been approved by your health care provider.  Apply  cool, wet cloths (cold compresses) to the affected areas.  Do not use a hot tub or take hot showers. Take lukewarm showers and baths.  Do not scratch your skin. Lifestyle   Do not use any products that contain nicotine or tobacco, such as cigarettes, e-cigarettes, and chewing tobacco. If you need help quitting, ask your health care provider.  Use techniques for stress reduction, such as meditation or yoga.  Maintain a healthy weight. Follow instructions from your health care provider for weight control. These may include dietary restrictions.  Get safe exposure to the sun as told by your health care provider. This may include spending regular intervals of time outdoors in sunlight. Do not get sunburned.  Consider joining a psoriasis support group. Medicines  Take or use over-the-counter and prescription medicines only as told by your health care provider.  If you were prescribed an antibiotic medicine, take it as told by your health care provider. Do not stop using the antibiotic even if you start to feel better. Alcohol use  Limit how much you use: ? 0-1 drink a day for women. ? 0-2 drinks a day for men.  Be aware of how much alcohol is in your drink. In the [place] one drink equals one 12 oz bottle of beer (355 mL), one 5 oz glass of wine (148 mL), or one 1 oz glass of hard liquor (44 mL). General instructions  Keep a journal to help track what triggers an outbreak. Try to avoid any triggers.  See a counselor if feelings of sadness, frustration, and hopelessness about your condition are interfering with your work and relationships.  Keep all follow-up visits as told by your health care provider. This is important. Contact a health care provider if:  You have a fever.  Your pain gets worse.  You have increasing redness or warmth in the affected areas.  You have new or worsening pain or stiffness in your joints.  Your nails start to break easily or pull away from the nail  bed.  You feel depressed. Summary  Psoriasis is a long-term (chronic) skin condition. Patches (plaques) may show up anywhere on your body.  There is no cure for this condition, but treatment can help manage it. Treatment varies, depending on the severity of your condition.  Keep a journal to track what triggers an outbreak. Try to avoid any triggers.  Take or use over-the-counter and  prescription medicines only as told by your health care provider.  Keep all follow-up visits as told by your health care provider. This is important. This information is not intended to replace advice given to you by your health care provider. Make sure you discuss any questions you have with your health care provider. Document Revised: 03/01/2018 Document Reviewed: 03/01/2018 Elsevier Patient Education  2020 [employer]   Hamstring Strain Rehab Ask your health care provider which exercises are safe for you. Do exercises exactly as told by your health care provider and adjust them as directed. It is normal to feel mild stretching, pulling, tightness, or discomfort as you do these exercises. Stop right away if you feel sudden pain or your pain gets worse. Do not begin these exercises until told by your health care provider. Stretching and range-of-motion exercises These exercises warm up your muscles and joints and improve the movement and flexibility of your thighs. These exercises also help to relieve pain, numbness, and tingling. Talk to your health care provider about these restrictions. Knee extension, seated  1. Sit with your left / right heel propped on a chair, a coffee table, or a footstool. Do not have anything under your knee to support it. 2. Allow your leg muscles to relax, letting gravity straighten out your knee (extension). You should feel a stretch behind your left / right knee. 3. If told by your health care provider, deepen the stretch by placing a __________ weight on your thigh, just  above your kneecap. 4. Hold this position for __________ seconds. Repeat __________ times. Complete this exercise __________ times a day. Seated stretch This exercise is sometimes called hamstrings and adductors stretch. 1. Sit on the floor with your legs stretched wide. Keep your knees straight during this exercise. 2. Keeping your head and back in a straight line, bend at your waist to reach for your left foot (position A). You should feel a stretch in your right inner thigh (adductors). 3. Hold this position for __________ seconds. Then slowly return to the upright position. 4. Keeping your head and back in a straight line, bend at your waist to reach forward (position B). You should feel a stretch behind both of your thighs or knees (hamstrings). 5. Hold this position for __________ seconds. Then slowly return to the upright position. 6. Keeping your head and back in a straight line, bend at your waist to reach for your right foot (position C). You should feel a stretch in your left inner thigh (adductors). 7. Hold this position for __________ seconds. Then slowly return to the upright position. Repeat __________ times. Complete this exercise __________ times a day. Hamstrings stretch, supine  1. Lie on your back (supine position). 2. Loop a belt or towel over the ball of your left / right foot. The ball of your foot is on the walking surface, right under your toes. 3. Straighten your left / right knee and slowly pull on the belt or towel to raise your leg. ? Do not let your left / right knee bend while you do this. ? Keep your other leg flat on the floor. ? Raise the left / right leg until you feel a gentle stretch behind your left / right knee or thigh (hamstrings). 4. Hold this position for __________ seconds. 5. Slowly return your leg to the starting position. Repeat __________ times. Complete this exercise __________ times a day. Strengthening exercises These exercises build  strength and endurance in your thighs. Endurance is the  ability to use your muscles for a long time, even after they get tired. Straight leg raises, prone This exercise strengthens the muscles that move the hips (hip extensors). 1. Lie on your abdomen on a firm surface (prone position). 2. Tense the muscles in your buttocks and lift your left / right leg about 4 inches (10 cm). Keep your knee straight as you lift your leg. If you cannot lift your leg that high without arching your back, place a pillow under your hips. 3. Hold the position for __________ seconds. 4. Slowly lower your leg to the starting position. 5. Allow your muscles to relax completely before you start the next repetition. Repeat __________ times. Complete this exercise __________ times a day. Bridge This exercise strengthens the muscles in your buttocks and the back of your thighs (hip extensors). 1. Lie on your back on a firm surface with your knees bent and your feet flat on the floor. 2. Tighten your buttocks muscles and lift your bottom off the floor until the trunk of your body is level with your thighs. ? You should feel the muscles working in your buttocks and the back of your thighs. ? Do not arch your back. 3. Hold this position for __________ seconds. 4. Slowly lower your hips to the starting position. 5. Let your buttocks muscles relax completely between repetitions. 6. If told by your health care provider, keep your bottom lifted off the floor while you slowly walk your feet away from you as far as you can control. Hold for __________ seconds, then slowly walk your feet back toward you. Repeat __________ times. Complete this exercise __________ times a day. Lateral walking with band This is an exercise in which you walk sideways (lateral), with tension provided by an exercise band. The exercise strengthens the muscles in your hip (hip abductors). 1. Stand in a long hallway. 2. Wrap a loop of exercise band around  your legs, just above your knees. 3. Bend your knees gently and drop your hips down and back so your weight is over your heels. 4. Step to the side to move down the length of the hallway, keeping your toes pointed ahead of you and keeping tension in the band. 5. Repeat, leading with your other leg. Repeat __________ times. Complete this exercise __________ times a day. Single leg stand with reaching This exercise is also called eccentric hamstring stretch. 1. Stand on your left / right foot. Keep your big toe down on the floor and try to keep your arch lifted. 2. Slowly reach down toward the floor as far as you can while keeping your balance. Lowering your thigh under tension is called eccentric stretching. 3. Hold this position for __________ seconds. Repeat __________ times. Complete this exercise __________ times a day. Plank, prone This exercise strengthens muscles in your abdomen and core area. 1. Lie on your abdomen on the floor (prone position),and prop yourself up on your elbows. Your hands should be straight out in front of you, and your elbows should be below your shoulders. Position your feet similar to a push-up position so your toes are on the ground. 2. Tighten your abdominal muscles and lift your body off the floor. ? Do not arch your back. ? Do not hold your breath. 3. Hold this position for __________ seconds. Repeat __________ times. Complete this exercise __________ times a day. This information is not intended to replace advice given to you by your health care provider. Make sure you discuss any questions  you have with your health care provider. Document Revised: 08/18/2018 Document Reviewed: 04/25/2018 [employer] Patient Education  2020 [employer].

## 2020-02-09 NOTE — Progress Notes (Signed)
Chief Complaint  Patient presents with  . Follow-up  . Arthritis   F/u  1. Joints pain shoulders, low back and hips at times knees and feeling stiff with pain 8/10 at times. Pain worse at night. Walking 2 miles unable to do due to pain which is not like her today pain 2/10 2.plantar fascitis suspected right >left heel given lists of exercises to do Stacey [name] (MRN [id number]) as of 02/13/2020 08:13  07/27/2019 07:20 Anti Nuclear Antibody (ANA): Negative Cyclic Citrullin Peptide Ab: 4 RA Latex Turbid.: <10.0 Stacey [name] (MRN [id number]) as of 02/13/2020 08:13  07/27/2019 07:20 Sed Rate: 2 CRP 0 - 10 mg/L <1    3. Wants referral dermatology will refer Dr. Kellie Moor she has thickened scale to b/l elbows and wants to try cream for this  4. Needs refill of questran for diarrhea  5. Frequent nosebleeds weekly wants referral to ENT 6. Still having female pelvic pain still trying to figure out if due to pelvic congestion syndrome or etiology and did not hear f/u from Dr. Leonides Schanz who wanted to get copy of colonoscopy.  CC'ed Dr. Leonides Schanz today   Review of Systems  Constitutional: Negative for weight loss.  HENT: Negative for hearing loss.   Eyes: Negative for blurred vision.  Respiratory: Negative for shortness of breath.   Cardiovascular: Negative for chest pain.  Gastrointestinal: Negative for abdominal pain.  Musculoskeletal: Positive for back pain and joint pain.  Skin: Positive for rash.  Neurological: Negative for headaches.  Psychiatric/Behavioral: Negative for depression.   Past Medical History:  Diagnosis Date  . Bleeding from the nose    nose cauterized 1980s  . GERD (gastroesophageal reflux disease)   . IBS (irritable bowel syndrome)   . Kidney cysts   . Meniere disease    left hearing loss 2012   . Pelvic congestion syndrome    Past Surgical History:  Procedure Laterality Date  . ABDOMINAL HYSTERECTOMY     ovaries intact 2017 2/2 DUB  .  ABLATION     DUB 10/29/04  . BREAST BIOPSY Right 2015   benign  . BREAST EXCISIONAL BIOPSY Right 2010   benign  . BREAST SURGERY     bx in 2017/2018 neg dense breast; clip placement right breast, 2008 fibroadenoma breast bx  . CHOLECYSTECTOMY     2015  . DILATION AND CURETTAGE OF UTERUS    . LAPAROSCOPY ABDOMEN DIAGNOSTIC    . TONSILLECTOMY     1983  . TUBAL LIGATION    . WISDOM TOOTH EXTRACTION     1989   Family History  Problem Relation Age of Onset  . Arthritis Mother   . Hypertension Mother   . Arthritis Father   . Hypertension Father   . Diabetes Father   . Hearing loss Father   . Hyperlipidemia Father   . Cancer Paternal Aunt        gu cancer ? type-endometrial  . Rheumatic fever Paternal Aunt   . Mental illness Paternal Aunt        cognitive impairment  . Arthritis Maternal Grandmother   . Diabetes Maternal Grandmother   . Hyperlipidemia Maternal Grandmother   . Hypertension Maternal Grandmother   . Arthritis Maternal Grandfather   . Hyperlipidemia Maternal Grandfather   . Hypertension Maternal Grandfather   . Arthritis Paternal Grandmother   . Diabetes Paternal Grandmother   . Hyperlipidemia Paternal Grandmother   . Hypertension Paternal Grandmother   . Arthritis Paternal Grandfather   .  Hyperlipidemia Paternal Grandfather   . Hypertension Paternal Grandfather   . Arthritis Sister   . Miscarriages / Stillbirths Sister   . Deep vein thrombosis Sister   . Scoliosis Sister   . Breast cancer Neg Hx    Social History   Socioeconomic History  . Marital status: Married    Spouse name: Not on file  . Number of children: Not on file  . Years of education: Not on file  . Highest education level: Not on file  Occupational History  . Not on file  Tobacco Use  . Smoking status: Never Smoker  . Smokeless tobacco: Never Used  Substance and Sexual Activity  . Alcohol use: Yes  . Drug use: Not Currently  . Sexual activity: Yes    Partners: Male  Other  Topics Concern  . Not on file  Social History Narrative   Moved from [place] 2019    2 kids girl and boy born 1995 and [place] 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       No guns    Wears seat belt    Safe in relationship    Never smoker    Social Determinants of Radio broadcast assistant Strain:   . Difficulty of Paying Living Expenses: Not on file  Food Insecurity:   . Worried About Charity fundraiser in the Last Year: Not on file  . Ran Out of Food in the Last Year: Not on file  Transportation Needs:   . Lack of Transportation (Medical): Not on file  . Lack of Transportation (Non-Medical): Not on file  Physical Activity:   . Days of Exercise per Week: Not on file  . Minutes of Exercise per Session: Not on file  Stress:   . Feeling of Stress : Not on file  Social Connections:   . Frequency of Communication with Friends and Family: Not on file  . Frequency of Social Gatherings with Friends and Family: Not on file  . Attends Religious Services: Not on file  . Active Member of Clubs or Organizations: Not on file  . Attends Archivist Meetings: Not on file  . Marital Status: Not on file  Intimate Partner Violence:   . Fear of Current or Ex-Partner: Not on file  . Emotionally Abused: Not on file  . Physically Abused: Not on file  . Sexually Abused: Not on file   Current Meds  Medication Sig  . Ascorbic Acid (VITAMIN C PO) Take by mouth.  . B Complex Vitamins (B COMPLEX 1 PO) Take by mouth.  . calcium carbonate (OS-CAL) 600 MG tablet Take 600 mg by mouth daily.  . Cholecalciferol ([employer] D3) 50 MCG (2000 UT) CAPS Take by mouth.  . cholestyramine (QUESTRAN) 4 g packet USE 1 PACKET MIXED AS DIRECTED DAILY  . Citicoline (COGNITIVE HEALTH PO) Take by mouth. cognium  . ECHINACEA EXTRACT PO Take 750 mg by mouth daily.  Marland Kitchen ELDERBERRY PO Take 50 mg by mouth daily.  Marland Kitchen FOLIC ACID PO Take by mouth.  . Ginger, Zingiber officinalis,  (GINGER PO) Take by mouth.  Marland Kitchen GLUCOSAMINE HCL PO Take by mouth.  . Lysine 1000 MG TABS Take 1 tablet by mouth daily.  . pantoprazole (PROTONIX) 40 MG tablet Take 1 tablet (40 mg total) by mouth daily. 30 minutes before food  . Probiotic Product (PROBIOTIC PO) Take by mouth.  . Turmeric (QC TUMERIC COMPLEX  PO) Take by mouth.  . zinc gluconate 50 MG tablet Take 50 mg by mouth daily.  . [DISCONTINUED] cholestyramine (QUESTRAN) 4 g packet USE 1 PACKET MIXED AS DIRECTED DAILY   Allergies  Allergen Reactions  . Other     Dairy and peanuts    No Stacey found for this or any previous visit (from the past 2160 hour(s)). Objective  Body mass index is 18.06 kg/m. Wt Readings from Last 3 Encounters:  02/09/20 118 lb 12.8 oz (53.9 kg)  07/05/19 119 lb 3.2 oz (54.1 kg)  09/21/18 115 lb (52.2 kg)   Temp Readings from Last 3 Encounters:  02/09/20 97.8 F (36.6 C) (Oral)  07/05/19 (!) 96.9 F (36.1 C) (Temporal)  09/21/18 (!) 96.7 F (35.9 C)   BP Readings from Last 3 Encounters:  02/09/20 130/70  07/05/19 110/76  09/21/18 101/60   Pulse Readings from Last 3 Encounters:  02/09/20 67  07/05/19 72  09/21/18 61    Physical Exam Vitals and nursing note reviewed.  Constitutional:      Appearance: Normal appearance. She is well-developed and well-groomed.  HENT:     Head: Normocephalic and atraumatic.  Eyes:     Conjunctiva/sclera: Conjunctivae normal.     Pupils: Pupils are equal, round, and reactive to light.  Cardiovascular:     Rate and Rhythm: Normal rate and regular rhythm.     Heart sounds: Normal heart sounds. No murmur heard.   Pulmonary:     Effort: Pulmonary effort is normal.     Breath sounds: Normal breath sounds.  Skin:    General: Skin is warm and dry.  Neurological:     General: No focal deficit present.     Mental Status: She is alert and oriented to person, place, and time. Mental status is at baseline.     Gait: Gait normal.  Psychiatric:         Attention and Perception: Attention and perception normal.        Mood and Affect: Mood and affect normal.        Speech: Speech normal.        Behavior: Behavior normal. Behavior is cooperative.        Thought Content: Thought content normal.        Cognition and Memory: Cognition and memory normal.        Judgment: Judgment normal.     Assessment  Plan  Chronic bilateral low back pain with bilateral sciatica - Plan: meloxicam (MOBIC) 7.5 MG tablet bid prn, cyclobenzaprine (FLEXERIL) 5 MG tablet, DG Lumbar Spine Complete, Ambulatory referral to Dermatology ? Psoriatic arthritis  Consider ortho and PT in the future  Given exercises   Bilateral hip pain and b/l shoulder, b/l knee pain- Plan: meloxicam (MOBIC) 7.5 MG tablet, DG Hip Unilat W OR W/O Pelvis 2-3 Views Left, DG Hip Unilat W OR W/O Pelvis 2-3 Views Right, Ambulatory referral to Dermatology Autoimmune labs 07/2019 negative  Pain of right heel - Plan: meloxicam (MOBIC) 7.5 MG tablet bid prn  voltaren gel  Consider podiatry in the future  Psoriasis - Plan: clobetasol cream (TEMOVATE) 0.05 %, Ambulatory referral to Dermatology  Irritable bowel syndrome with diarrhea - Plan: cholestyramine (QUESTRAN) 4 g packet History of cholecystectomy - Plan: cholestyramine (QUESTRAN) 4 g packet   Bleeding nose - Plan: Ambulatory referral to ENT D.r Kathyrn Sheriff  Female pelvic pain CC Dr. Leonides Schanz see HPI  HM Flu shotwill need for 2021 Prev disc Tdap no recent in old  PCP notes MMR immune  rec hep B vaccine Consider shingrix age 83 y.o disc to consider  covid 19 x 2/2 moderna   Consider check hep C next labs disc with patient   Pap had 05/2017 get recordsobtained 05/20/15 normal neg HPVno h/o abnormal pap s/physterectomywith b/l ovaries intact  mammo 11/21/18 negative  -referred mammogram has right breast clip h/o breast calcifications, h/o fibroadenoma  -given phone # to call [facility] today  Right breast bx 03/09/2007 bx  fibroadenoma or firbrous mastopathy ~4 cm so due to size excised path fibrocystic changes fibrous mastopathy, columnar cell change w/o atypia focal sclerosis adenosis    Egd/colonoscopy had 2017 get records -per notes colonoscopy in 08/2006 and 12/2012 2/2 diarrhea + internal hemorrhoids bxs negative for colitis  -EGD/colonoscopy 09/02/06 mild distal esophagitis/mild gastritis, small hiatal hernia; no polyps or colitis -repeat colonoscopy due 12/2022  Skin referred Dr. Kellie Moor  Never smoker  HIV neg 09/29/16  rec healthy diet and exercise     Provider: Dr. Olivia Mackie [facility]

## 2020-02-13 ENCOUNTER — Encounter: Payer: Self-pay | Admitting: *Deleted

## 2020-02-13 DIAGNOSIS — D2262 Melanocytic nevi of left upper limb, including shoulder: Secondary | ICD-10-CM | POA: Diagnosis not present

## 2020-02-13 DIAGNOSIS — D2261 Melanocytic nevi of right upper limb, including shoulder: Secondary | ICD-10-CM | POA: Diagnosis not present

## 2020-02-13 DIAGNOSIS — D2271 Melanocytic nevi of right lower limb, including hip: Secondary | ICD-10-CM | POA: Diagnosis not present

## 2020-02-13 DIAGNOSIS — D225 Melanocytic nevi of trunk: Secondary | ICD-10-CM | POA: Diagnosis not present

## 2020-02-13 DIAGNOSIS — L400 Psoriasis vulgaris: Secondary | ICD-10-CM | POA: Diagnosis not present

## 2020-03-05 DIAGNOSIS — R040 Epistaxis: Secondary | ICD-10-CM | POA: Diagnosis not present

## 2020-03-05 DIAGNOSIS — J301 Allergic rhinitis due to pollen: Secondary | ICD-10-CM | POA: Diagnosis not present

## 2020-03-05 DIAGNOSIS — J342 Deviated nasal septum: Secondary | ICD-10-CM | POA: Diagnosis not present

## 2020-04-09 ENCOUNTER — Other Ambulatory Visit: Payer: 59

## 2020-04-09 ENCOUNTER — Other Ambulatory Visit: Payer: Self-pay

## 2020-04-09 ENCOUNTER — Other Ambulatory Visit: Payer: Self-pay | Admitting: Internal Medicine

## 2020-04-09 ENCOUNTER — Encounter: Payer: Self-pay | Admitting: Internal Medicine

## 2020-04-09 ENCOUNTER — Ambulatory Visit (INDEPENDENT_AMBULATORY_CARE_PROVIDER_SITE_OTHER): Payer: 59

## 2020-04-09 DIAGNOSIS — M2550 Pain in unspecified joint: Secondary | ICD-10-CM

## 2020-04-09 DIAGNOSIS — M79672 Pain in left foot: Secondary | ICD-10-CM | POA: Diagnosis not present

## 2020-04-09 NOTE — Addendum Note (Signed)
Addended by: Warden Fillers on: 04/09/2020 03:08 PM   Modules accepted: Orders

## 2020-04-22 DIAGNOSIS — Z20828 Contact with and (suspected) exposure to other viral communicable diseases: Secondary | ICD-10-CM | POA: Diagnosis not present

## 2020-04-22 DIAGNOSIS — U071 COVID-19: Secondary | ICD-10-CM | POA: Diagnosis not present

## 2020-04-29 DIAGNOSIS — Z20828 Contact with and (suspected) exposure to other viral communicable diseases: Secondary | ICD-10-CM | POA: Diagnosis not present

## 2020-04-29 DIAGNOSIS — U071 COVID-19: Secondary | ICD-10-CM | POA: Diagnosis not present

## 2020-05-13 DIAGNOSIS — Z20828 Contact with and (suspected) exposure to other viral communicable diseases: Secondary | ICD-10-CM | POA: Diagnosis not present

## 2020-05-13 DIAGNOSIS — U071 COVID-19: Secondary | ICD-10-CM | POA: Diagnosis not present

## 2020-05-20 DIAGNOSIS — U071 COVID-19: Secondary | ICD-10-CM | POA: Diagnosis not present

## 2020-05-20 DIAGNOSIS — Z20828 Contact with and (suspected) exposure to other viral communicable diseases: Secondary | ICD-10-CM | POA: Diagnosis not present

## 2020-05-28 DIAGNOSIS — Z20828 Contact with and (suspected) exposure to other viral communicable diseases: Secondary | ICD-10-CM | POA: Diagnosis not present

## 2020-05-28 DIAGNOSIS — U071 COVID-19: Secondary | ICD-10-CM | POA: Diagnosis not present

## 2020-06-03 DIAGNOSIS — U071 COVID-19: Secondary | ICD-10-CM | POA: Diagnosis not present

## 2020-06-03 DIAGNOSIS — Z20828 Contact with and (suspected) exposure to other viral communicable diseases: Secondary | ICD-10-CM | POA: Diagnosis not present

## 2020-06-06 DIAGNOSIS — H524 Presbyopia: Secondary | ICD-10-CM | POA: Diagnosis not present

## 2020-06-10 DIAGNOSIS — Z20828 Contact with and (suspected) exposure to other viral communicable diseases: Secondary | ICD-10-CM | POA: Diagnosis not present

## 2020-06-10 DIAGNOSIS — U071 COVID-19: Secondary | ICD-10-CM | POA: Diagnosis not present

## 2020-06-17 DIAGNOSIS — U071 COVID-19: Secondary | ICD-10-CM | POA: Diagnosis not present

## 2020-06-17 DIAGNOSIS — Z20828 Contact with and (suspected) exposure to other viral communicable diseases: Secondary | ICD-10-CM | POA: Diagnosis not present

## 2020-06-24 DIAGNOSIS — U071 COVID-19: Secondary | ICD-10-CM | POA: Diagnosis not present

## 2020-06-24 DIAGNOSIS — Z20828 Contact with and (suspected) exposure to other viral communicable diseases: Secondary | ICD-10-CM | POA: Diagnosis not present

## 2020-07-01 DIAGNOSIS — Z20828 Contact with and (suspected) exposure to other viral communicable diseases: Secondary | ICD-10-CM | POA: Diagnosis not present

## 2020-07-01 DIAGNOSIS — U071 COVID-19: Secondary | ICD-10-CM | POA: Diagnosis not present

## 2020-07-05 ENCOUNTER — Encounter: Payer: Self-pay | Admitting: Internal Medicine

## 2020-07-05 ENCOUNTER — Other Ambulatory Visit: Payer: Self-pay | Admitting: Internal Medicine

## 2020-07-05 ENCOUNTER — Other Ambulatory Visit: Payer: Self-pay

## 2020-07-05 ENCOUNTER — Ambulatory Visit (INDEPENDENT_AMBULATORY_CARE_PROVIDER_SITE_OTHER): Payer: 59 | Admitting: Internal Medicine

## 2020-07-05 VITALS — BP 106/70 | HR 71 | Temp 97.8°F | Ht 65.67 in | Wt 119.0 lb

## 2020-07-05 DIAGNOSIS — R200 Anesthesia of skin: Secondary | ICD-10-CM | POA: Diagnosis not present

## 2020-07-05 DIAGNOSIS — D696 Thrombocytopenia, unspecified: Secondary | ICD-10-CM

## 2020-07-05 DIAGNOSIS — R5383 Other fatigue: Secondary | ICD-10-CM | POA: Diagnosis not present

## 2020-07-05 DIAGNOSIS — Z1389 Encounter for screening for other disorder: Secondary | ICD-10-CM

## 2020-07-05 DIAGNOSIS — Z1329 Encounter for screening for other suspected endocrine disorder: Secondary | ICD-10-CM

## 2020-07-05 DIAGNOSIS — D72819 Decreased white blood cell count, unspecified: Secondary | ICD-10-CM

## 2020-07-05 DIAGNOSIS — M5431 Sciatica, right side: Secondary | ICD-10-CM | POA: Diagnosis not present

## 2020-07-05 DIAGNOSIS — Z0000 Encounter for general adult medical examination without abnormal findings: Secondary | ICD-10-CM | POA: Diagnosis not present

## 2020-07-05 DIAGNOSIS — M5416 Radiculopathy, lumbar region: Secondary | ICD-10-CM

## 2020-07-05 DIAGNOSIS — Z1231 Encounter for screening mammogram for malignant neoplasm of breast: Secondary | ICD-10-CM | POA: Diagnosis not present

## 2020-07-05 DIAGNOSIS — E538 Deficiency of other specified B group vitamins: Secondary | ICD-10-CM | POA: Diagnosis not present

## 2020-07-05 DIAGNOSIS — R202 Paresthesia of skin: Secondary | ICD-10-CM

## 2020-07-05 DIAGNOSIS — M5432 Sciatica, left side: Secondary | ICD-10-CM

## 2020-07-05 LAB — LIPID PANEL
Cholesterol: 180 mg/dL (ref 0–200)
HDL: 71.6 mg/dL (ref >39.00)
LDL Cholesterol: 90 mg/dL (ref 0–99)
NonHDL: 108.71
Total CHOL/HDL Ratio: 3
Triglycerides: 96 mg/dL (ref 0.0–149.0)
VLDL: 19.2 mg/dL (ref 0.0–40.0)

## 2020-07-05 LAB — COMPREHENSIVE METABOLIC PANEL
ALT: 12 U/L (ref 0–35)
AST: 18 U/L (ref 0–37)
Albumin: 4.3 g/dL (ref 3.5–5.2)
Alkaline Phosphatase: 30 U/L — ABNORMAL LOW (ref 39–117)
BUN: 14 mg/dL (ref 6–23)
CO2: 33 mEq/L — ABNORMAL HIGH (ref 19–32)
Calcium: 9.3 mg/dL (ref 8.4–10.5)
Chloride: 103 mEq/L (ref 96–112)
Creatinine, Ser: 0.91 mg/dL (ref 0.40–1.20)
GFR: 73.32 mL/min (ref >60.00)
Glucose, Bld: 76 mg/dL (ref 70–99)
Potassium: 4.5 mEq/L (ref 3.5–5.1)
Sodium: 141 mEq/L (ref 135–145)
Total Bilirubin: 0.6 mg/dL (ref 0.2–1.2)
Total Protein: 6.5 g/dL (ref 6.0–8.3)

## 2020-07-05 LAB — TSH: TSH: 1.67 u[IU]/mL (ref 0.35–4.50)

## 2020-07-05 LAB — CBC WITH DIFFERENTIAL/PLATELET
Basophils Absolute: 0 10*3/uL (ref 0.0–0.1)
Basophils Relative: 1.2 % (ref 0.0–3.0)
Eosinophils Absolute: 0 10*3/uL (ref 0.0–0.7)
Eosinophils Relative: 0.9 % (ref 0.0–5.0)
HCT: 39.4 % (ref 36.0–46.0)
Hemoglobin: 13.3 g/dL (ref 12.0–15.0)
Lymphocytes Relative: 29.7 % (ref 12.0–46.0)
Lymphs Abs: 1.1 10*3/uL (ref 0.7–4.0)
MCHC: 33.7 g/dL (ref 30.0–36.0)
MCV: 92.4 fl (ref 78.0–100.0)
Monocytes Absolute: 0.3 10*3/uL (ref 0.1–1.0)
Monocytes Relative: 9.2 % (ref 3.0–12.0)
Neutro Abs: 2.1 10*3/uL (ref 1.4–7.7)
Neutrophils Relative %: 59 % (ref 43.0–77.0)
Platelets: 147 10*3/uL — ABNORMAL LOW (ref 150.0–400.0)
RBC: 4.27 Mil/uL (ref 3.87–5.11)
RDW: 12.5 % (ref 11.5–15.5)
WBC: 3.6 10*3/uL — ABNORMAL LOW (ref 4.0–10.5)

## 2020-07-05 LAB — VITAMIN B12: Vitamin B-12: 484 pg/mL (ref 211–911)

## 2020-07-05 NOTE — Patient Instructions (Signed)
Sciatica  Sciatica is pain, numbness, weakness, or tingling along the path of the sciatic nerve. The sciatic nerve starts in the lower back and runs down the back of each leg. The nerve controls the muscles in the lower leg and in the back of the knee. It also provides feeling (sensation) to the back of the thigh, the lower leg, and the sole of the foot. Sciatica is a symptom of another medical condition that pinches or puts pressure on the sciatic nerve. Sciatica most often only affects one side of the body. Sciatica usually goes away on its own or with treatment. In some cases, sciatica may come back (recur). What are the causes? This condition is caused by pressure on the sciatic nerve or pinching of the nerve. This may be the result of:  A disk in between the bones of the spine bulging out too far (herniated disk).  Age-related changes in the spinal disks.  A pain disorder that affects a muscle in the buttock.  Extra bone growth near the sciatic nerve.  A break (fracture) of the pelvis.  Pregnancy.  Tumor. This is rare. What increases the risk? The following factors may make you more likely to develop this condition:  Playing sports that place pressure or stress on the spine.  Having poor strength and flexibility.  A history of back injury or surgery.  Sitting for long periods of time.  Doing activities that involve repetitive bending or lifting.  Obesity. What are the signs or symptoms? Symptoms can vary from mild to very severe, and they may include:  Any of these problems in the lower back, leg, hip, or buttock: ? Mild tingling, numbness, or dull aches. ? Burning sensations. ? Sharp pains.  Numbness in the back of the calf or the sole of the foot.  Leg weakness.  Severe back pain that makes movement difficult. Symptoms may get worse when you cough, sneeze, or laugh, or when you sit or stand for long periods of time. How is this diagnosed? This condition may be  diagnosed based on:  Your symptoms and medical history.  A physical exam.  Blood tests.  Imaging tests, such as: ? X-rays. ? MRI. ? CT scan. How is this treated? In many cases, this condition improves on its own without treatment. However, treatment may include:  Reducing or modifying physical activity.  Exercising and stretching.  Icing and applying heat to the affected area.  Medicines that help to: ? Relieve pain and swelling. ? Relax your muscles.  Injections of medicines that help to relieve pain, irritation, and inflammation around the sciatic nerve (steroids).  Surgery. Follow these instructions at home: Medicines  Take over-the-counter and prescription medicines only as told by your health care provider.  Ask your health care provider if the medicine prescribed to you: ? Requires you to avoid driving or using heavy machinery. ? Can cause constipation. You may need to take these actions to prevent or treat constipation:  Drink enough fluid to keep your urine pale yellow.  Take over-the-counter or prescription medicines.  Eat foods that are high in fiber, such as beans, whole grains, and fresh fruits and vegetables.  Limit foods that are high in fat and processed sugars, such as fried or sweet foods. Managing pain  If directed, put ice on the affected area. ? Put ice in a plastic bag. ? Place a towel between your skin and the bag. ? Leave the ice on for 20 minutes, 2-3 times a day.    If directed, apply heat to the affected area. Use the heat source that your health care provider recommends, such as a moist heat pack or a heating pad. ? Place a towel between your skin and the heat source. ? Leave the heat on for 20-30 minutes. ? Remove the heat if your skin turns bright red. This is especially important if you are unable to feel pain, heat, or cold. You may have a greater risk of getting burned.      Activity  Return to your normal activities as told by  your health care provider. Ask your health care provider what activities are safe for you.  Avoid activities that make your symptoms worse.  Take brief periods of rest throughout the day. ? When you rest for longer periods, mix in some mild activity or stretching between periods of rest. This will help to prevent stiffness and pain. ? Avoid sitting for long periods of time without moving. Get up and move around at least one time each hour.  Exercise and stretch regularly, as told by your health care provider.  Do not lift anything that is heavier than 10 lb (4.5 kg) while you have symptoms of sciatica. When you do not have symptoms, you should still avoid heavy lifting, especially repetitive heavy lifting.  When you lift objects, always use proper lifting technique, which includes: ? Bending your knees. ? Keeping the load close to your body. ? Avoiding twisting.   General instructions  Maintain a healthy weight. Excess weight puts extra stress on your back.  Wear supportive, comfortable shoes. Avoid wearing high heels.  Avoid sleeping on a mattress that is too soft or too hard. A mattress that is firm enough to support your back when you sleep may help to reduce your pain.  Keep all follow-up visits as told by your health care provider. This is important. Contact a health care provider if:  You have pain that: ? Wakes you up when you are sleeping. ? Gets worse when you lie down. ? Is worse than you have experienced in the past. ? Lasts longer than 4 weeks.  You have an unexplained weight loss. Get help right away if:  You are not able to control when you urinate or have bowel movements (incontinence).  You have: ? Weakness in your lower back, pelvis, buttocks, or legs that gets worse. ? Redness or swelling of your back. ? A burning sensation when you urinate. Summary  Sciatica is pain, numbness, weakness, or tingling along the path of the sciatic nerve.  This condition  is caused by pressure on the sciatic nerve or pinching of the nerve.  Sciatica can cause pain, numbness, or tingling in the lower back, legs, hips, and buttocks.  Treatment often includes rest, exercise, medicines, and applying ice or heat. This information is not intended to replace advice given to you by your health care provider. Make sure you discuss any questions you have with your health care provider. Document Revised: 05/16/2018 Document Reviewed: 05/16/2018 Elsevier Patient Education  2021 [employer] Inc.   Sciatica Rehab Ask your health care provider which exercises are safe for you. Do exercises exactly as told by your health care provider and adjust them as directed. It is normal to feel mild stretching, pulling, tightness, or discomfort as you do these exercises. Stop right away if you feel sudden pain or your pain gets worse. Do not begin these exercises until told by your health care provider. Stretching and range-of-motion exercises These   exercises warm up your muscles and joints and improve the movement and flexibility of your hips and back. These exercises also help to relieve pain, numbness, and tingling. Sciatic nerve glide 1. Sit in a chair with your head facing down toward your chest. Place your hands behind your back. Let your shoulders slump forward. 2. Slowly straighten one of your legs while you tilt your head back as if you are looking toward the ceiling. Only straighten your leg as far as you can without making your symptoms worse. 3. Hold this position for __________ seconds. 4. Slowly return to the starting position. 5. Repeat with your other leg. Repeat __________ times. Complete this exercise __________ times a day. Knee to chest with hip adduction and internal rotation 1. Lie on your back on a firm surface with both legs straight. 2. Bend one of your knees and move it up toward your chest until you feel a gentle stretch in your lower back and buttock. Then, move  your knee toward the shoulder that is on the opposite side from your leg. This is hip adduction and internal rotation. ? Hold your leg in this position by holding on to the front of your knee. 3. Hold this position for __________ seconds. 4. Slowly return to the starting position. 5. Repeat with your other leg. Repeat __________ times. Complete this exercise __________ times a day.   Prone extension on elbows 1. Lie on your abdomen on a firm surface. A bed may be too soft for this exercise. 2. Prop yourself up on your elbows. 3. Use your arms to help lift your chest up until you feel a gentle stretch in your abdomen and your lower back. ? This will place some of your body weight on your elbows. If this is uncomfortable, try stacking pillows under your chest. ? Your hips should stay down, against the surface that you are lying on. Keep your hip and back muscles relaxed. 4. Hold this position for __________ seconds. 5. Slowly relax your upper body and return to the starting position. Repeat __________ times. Complete this exercise __________ times a day.   Strengthening exercises These exercises build strength and endurance in your back. Endurance is the ability to use your muscles for a long time, even after they get tired. Pelvic tilt This exercise strengthens the muscles that lie deep in the abdomen. 1. Lie on your back on a firm surface. Bend your knees and keep your feet flat on the floor. 2. Tense your abdominal muscles. Tip your pelvis up toward the ceiling and flatten your lower back into the floor. ? To help with this exercise, you may place a small towel under your lower back and try to push your back into the towel. 3. Hold this position for __________ seconds. 4. Let your muscles relax completely before you repeat this exercise. Repeat __________ times. Complete this exercise __________ times a day. Alternating arm and leg raises 1. Get on your hands and knees on a firm surface.  If you are on a hard floor, you may want to use padding, such as an exercise mat, to cushion your knees. 2. Line up your arms and legs. Your hands should be directly below your shoulders, and your knees should be directly below your hips. 3. Lift your left leg behind you. At the same time, raise your right arm and straighten it in front of you. ? Do not lift your leg higher than your hip. ? Do not lift your arm   higher than your shoulder. ? Keep your abdominal and back muscles tight. ? Keep your hips facing the ground. ? Do not arch your back. ? Keep your balance carefully, and do not hold your breath. 4. Hold this position for __________ seconds. 5. Slowly return to the starting position. 6. Repeat with your right leg and your left arm. Repeat __________ times. Complete this exercise __________ times a day.   Posture and body mechanics Good posture and healthy body mechanics can help to relieve stress in your body's tissues and joints. Body mechanics refers to the movements and positions of your body while you do your daily activities. Posture is part of body mechanics. Good posture means:  Your spine is in its natural S-curve position (neutral).  Your shoulders are pulled back slightly.  Your head is not tipped forward. Follow these guidelines to improve your posture and body mechanics in your everyday activities. Standing  When standing, keep your spine neutral and your feet about hip width apart. Keep a slight bend in your knees. Your ears, shoulders, and hips should line up.  When you do a task in which you stand in one place for a long time, place one foot up on a stable object that is 2-4 inches (5-10 cm) high, such as a footstool. This helps keep your spine neutral.   Sitting  When sitting, keep your spine neutral and keep your feet flat on the floor. Use a footrest, if necessary, and keep your thighs parallel to the floor. Avoid rounding your shoulders, and avoid tilting your  head forward.  When working at a desk or a computer, keep your desk at a height where your hands are slightly lower than your elbows. Slide your chair under your desk so you are close enough to maintain good posture.  When working at a computer, place your monitor at a height where you are looking straight ahead and you do not have to tilt your head forward or downward to look at the screen.   Resting  When lying down and resting, avoid positions that are most painful for you.  If you have pain with activities such as sitting, bending, stooping, or squatting, lie in a position in which your body does not bend very much. For example, avoid curling up on your side with your arms and knees near your chest (fetal position).  If you have pain with activities such as standing for a long time or reaching with your arms, lie with your spine in a neutral position and bend your knees slightly. Try the following positions: ? Lying on your side with a pillow between your knees. ? Lying on your back with a pillow under your knees. Lifting  When lifting objects, keep your feet at least shoulder width apart and tighten your abdominal muscles.  Bend your knees and hips and keep your spine neutral. It is important to lift using the strength of your legs, not your back. Do not lock your knees straight out.  Always ask for help to lift heavy or awkward objects.   This information is not intended to replace advice given to you by your health care provider. Make sure you discuss any questions you have with your health care provider. Document Revised: 08/19/2018 Document Reviewed: 05/19/2018 Elsevier Patient Education  2021 [employer] Inc.  

## 2020-07-05 NOTE — Progress Notes (Addendum)
Chief Complaint  Patient presents with  . Annual Exam   Annual 1. C/o buttock pain b/l but R>L and numbness and tingling b/l legs and had foot pain Xray normal and low back Xray mild degenerative changes  Checked for RA/autoimmune and negative  For now declines lyme, MM w/u will consider in future  She does have psoriasis which I reviewed could cause joint pain    Review of Systems  Constitutional: Negative for weight loss.  HENT: Negative for hearing loss.   Eyes: Negative for blurred vision.  Respiratory: Negative for shortness of breath.   Cardiovascular: Negative for chest pain.  Gastrointestinal: Negative for abdominal pain.  Musculoskeletal: Positive for back pain and joint pain. Negative for falls.  Skin: Negative for rash.  Neurological: Positive for sensory change.  Psychiatric/Behavioral: Negative for depression.   Past Medical History:  Diagnosis Date  . Bleeding from the nose    nose cauterized 1980s  . GERD (gastroesophageal reflux disease)   . IBS (irritable bowel syndrome)   . Kidney cysts   . Meniere disease    left hearing loss 2012   . Pelvic congestion syndrome    Past Surgical History:  Procedure Laterality Date  . ABDOMINAL HYSTERECTOMY     ovaries intact 2017 2/2 DUB  . ABLATION     DUB 10/29/04  . BREAST BIOPSY Right 2015   benign  . BREAST EXCISIONAL BIOPSY Right 2010   benign  . BREAST SURGERY     bx in 2017/2018 neg dense breast; clip placement right breast, 2008 fibroadenoma breast bx  . CHOLECYSTECTOMY     2015  . DILATION AND CURETTAGE OF UTERUS    . LAPAROSCOPY ABDOMEN DIAGNOSTIC    . TONSILLECTOMY     1983  . TUBAL LIGATION    . WISDOM TOOTH EXTRACTION     1989   Family History  Problem Relation Age of Onset  . Arthritis Mother   . Hypertension Mother   . Arthritis Father   . Hypertension Father   . Diabetes Father   . Hearing loss Father   . Hyperlipidemia Father   . Cancer Paternal Aunt        gu cancer ?  type-endometrial  . Rheumatic fever Paternal Aunt   . Mental illness Paternal Aunt        cognitive impairment  . Arthritis Maternal Grandmother   . Diabetes Maternal Grandmother   . Hyperlipidemia Maternal Grandmother   . Hypertension Maternal Grandmother   . Arthritis Maternal Grandfather   . Hyperlipidemia Maternal Grandfather   . Hypertension Maternal Grandfather   . Arthritis Paternal Grandmother   . Diabetes Paternal Grandmother   . Hyperlipidemia Paternal Grandmother   . Hypertension Paternal Grandmother   . Arthritis Paternal Grandfather   . Hyperlipidemia Paternal Grandfather   . Hypertension Paternal Grandfather   . Arthritis Sister   . Miscarriages / Stillbirths Sister   . Deep vein thrombosis Sister   . Scoliosis Sister   . Breast cancer Neg Hx    Social History   Socioeconomic History  . Marital status: Married    Spouse name: Not on file  . Number of children: Not on file  . Years of education: Not on file  . Highest education level: Not on file  Occupational History  . Not on file  Tobacco Use  . Smoking status: Never Smoker  . Smokeless tobacco: Never Used  Substance and Sexual Activity  . Alcohol use: Yes  . Drug use:  Not Currently  . Sexual activity: Yes    Partners: Male  Other Topics Concern  . Not on file  Social History Narrative   Moved from [place] 2019    2 kids girl and boy born 1995 and [place] 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       No guns    Wears seat belt    Safe in relationship    Never smoker    Social Determinants of Radio broadcast assistant Strain: Not on file  Food Insecurity: Not on file  Transportation Needs: Not on file  Physical Activity: Not on file  Stress: Not on file  Social Connections: Not on file  Intimate Partner Violence: Not on file   Current Meds  Medication Sig  . Ascorbic Acid (VITAMIN C PO) Take by mouth.  . B Complex Vitamins (B COMPLEX 1 PO) Take by  mouth.  . calcium carbonate (OS-CAL) 600 MG tablet Take 600 mg by mouth daily.  . Cholecalciferol (CVS D3) 50 MCG (2000 UT) CAPS Take by mouth.  . cholestyramine (QUESTRAN) 4 g packet USE 1 PACKET MIXED AS DIRECTED DAILY  . Citicoline (COGNITIVE HEALTH PO) Take by mouth. cognium  . clobetasol cream (TEMOVATE) 2.87 % Apply 1 application topically 2 (two) times daily. Prn elbows  . COLLAGEN PO Take by mouth.  . cyclobenzaprine (FLEXERIL) 5 MG tablet Take 1 tablet (5 mg total) by mouth at bedtime as needed for muscle spasms.  Marland Kitchen ECHINACEA EXTRACT PO Take 750 mg by mouth daily.  Marland Kitchen ELDERBERRY PO Take 50 mg by mouth daily.  Marland Kitchen FOLIC ACID PO Take by mouth.  . Ginger, Zingiber officinalis, (GINGER PO) Take by mouth.  Marland Kitchen GLUCOSAMINE HCL PO Take by mouth.  . Lysine 1000 MG TABS Take 1 tablet by mouth daily.  . meloxicam (MOBIC) 7.5 MG tablet Take 1 tablet (7.5 mg total) by mouth daily.  . pantoprazole (PROTONIX) 40 MG tablet Take 1 tablet (40 mg total) by mouth daily. 30 minutes before food  . Probiotic Product (PROBIOTIC PO) Take by mouth.  . Turmeric (QC TUMERIC COMPLEX PO) Take by mouth.  . zinc gluconate 50 MG tablet Take 50 mg by mouth daily.   Allergies  Allergen Reactions  . Other     Dairy and peanuts    No results found for this or any previous visit (from the past 2160 hour(s)). Objective  Body mass index is 19.4 kg/m. Wt Readings from Last 3 Encounters:  07/05/20 119 lb (54 kg)  02/09/20 118 lb 12.8 oz (53.9 kg)  07/05/19 119 lb 3.2 oz (54.1 kg)   Temp Readings from Last 3 Encounters:  07/05/20 97.8 F (36.6 C) (Oral)  02/09/20 97.8 F (36.6 C) (Oral)  07/05/19 (!) 96.9 F (36.1 C) (Temporal)   BP Readings from Last 3 Encounters:  07/05/20 106/70  02/09/20 130/70  07/05/19 110/76   Pulse Readings from Last 3 Encounters:  07/05/20 71  02/09/20 67  07/05/19 72    Physical Exam Vitals and nursing note reviewed.  Constitutional:      Appearance: Normal appearance.  She is well-developed and well-groomed.  HENT:     Head: Normocephalic and atraumatic.  Eyes:     Conjunctiva/sclera: Conjunctivae normal.     Pupils: Pupils are equal, round, and reactive to light.  Cardiovascular:     Rate and Rhythm: Normal rate and regular rhythm.  Heart sounds: Normal heart sounds. No murmur heard.   Pulmonary:     Effort: Pulmonary effort is normal.     Breath sounds: Normal breath sounds.  Chest:     Chest wall: No mass.  Breasts:     Breasts are asymmetrical.     Right: Normal. No axillary adenopathy.     Left: Normal. No axillary adenopathy.      Comments: Left breast larger than right no change   Abdominal:     Tenderness: There is no abdominal tenderness.  Lymphadenopathy:     Upper Body:     Right upper body: No axillary adenopathy.     Left upper body: No axillary adenopathy.  Skin:    General: Skin is warm and dry.  Neurological:     General: No focal deficit present.     Mental Status: She is alert and oriented to person, place, and time. Mental status is at baseline.     Gait: Gait normal.  Psychiatric:        Attention and Perception: Attention and perception normal.        Mood and Affect: Mood and affect normal.        Speech: Speech normal.        Behavior: Behavior normal. Behavior is cooperative.        Thought Content: Thought content normal.        Cognition and Memory: Cognition and memory normal.        Judgment: Judgment normal.     Assessment  Plan  Annual physical exam - Plan: Comprehensive metabolic panel, Lipid panel, CBC with Differential/Platelet, TSH, Urinalysis, Routine w reflex microscopic, Vitamin B12 Flu shot utd Prev disc Tdap no recent in old PCP notes MMR immune  rec hep B vaccine Consider shingrix age 1 y.odisc to consider  covid 19 x 3/3 moderna   Consider check hep C next labs disc with patient in future  Pap had 05/2017 get recordsobtained 05/20/15 normal neg HPVno h/o abnormal pap  s/physterectomywith b/l ovaries intact  mammo7/13/20 negative -referred mammogram has right breast clip h/o breast calcifications, h/o fibroadenoma  -11/22/19 neg ordered 11/2020   Right breast bx 03/09/2007 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 so due to size excised path fibrocystic changes fibrous mastopathy, columnar cell change w/o atypia focal sclerosis adenosis    Egd/colonoscopy had 2017 get records -per notes colonoscopy in 08/2006 and 12/2012 2/2 diarrhea + internal hemorrhoids bxs negative for colitis  -EGD/colonoscopy 09/02/06 mild distal esophagitis/mild gastritis, small hiatal hernia; no polyps or colitis -repeat colonoscopy due 12/2022  Skin referred Dr. Kellie Moor  Never smoker  HIV neg 09/29/16 rec healthy diet and exercise  Joint pain ddx psa, h/o tick bite lyme vs seroneg arthritis r/o MM in the future if MRI lumbar negative  -consider rheum in future   Bilateral sciatica - Plan: MR Lumbar Spine Wo Contrast Lumbar radiculopathy - Plan: MR Lumbar Spine Wo Contrast Numbness/tingling b/l r/o herniated disc  Sx's >6 weeks since 02/09/2020 had Xray  She tried home exercises since 02/2020 and otc nsaids w/ help FINDINGS: Five non-rib-bearing lumbar vertebral bodies are identified.  Dextrocurvature centered at the L1-2 level. Alignment is otherwise normal. There is no evidence of lumbar spine fracture. Mild multilevel degenerative changes of the spine. Intervertebral disc spaces are maintained.  Right upper quadrant cholecystectomy clips.  IMPRESSION: No acute displaced fracture or traumatic listhesis of the lumbar spine.   Electronically Signed   By: Clelia Croft.D.  On: 02/10/2020 20:31    Provider: Dr. Olivia Mackie McLean-Scocuzza-Internal Medicine

## 2020-07-05 NOTE — Addendum Note (Signed)
Addended by: Warden Fillers on: 07/05/2020 04:46 PM   Modules accepted: Orders

## 2020-07-06 LAB — URINALYSIS, ROUTINE W REFLEX MICROSCOPIC
Bilirubin, UA: NEGATIVE
Glucose, UA: NEGATIVE
Ketones, UA: NEGATIVE
Leukocytes,UA: NEGATIVE
Nitrite, UA: NEGATIVE
Protein,UA: NEGATIVE
RBC, UA: NEGATIVE
Specific Gravity, UA: 1.012 (ref 1.005–1.030)
Urobilinogen, Ur: 0.2 mg/dL (ref 0.2–1.0)
pH, UA: 8 — ABNORMAL HIGH (ref 5.0–7.5)

## 2020-07-09 LAB — PATHOLOGIST SMEAR REVIEW
Basophils Absolute: 0.1 10*3/uL (ref 0.0–0.2)
Basos: 1 %
EOS (ABSOLUTE): 0 10*3/uL (ref 0.0–0.4)
Eos: 1 %
Hematocrit: 40.9 % (ref 34.0–46.6)
Hemoglobin: 13.7 g/dL (ref 11.1–15.9)
Immature Grans (Abs): 0 10*3/uL (ref 0.0–0.1)
Immature Granulocytes: 0 %
Lymphocytes Absolute: 1.1 10*3/uL (ref 0.7–3.1)
Lymphs: 29 %
MCH: 32.2 pg (ref 26.6–33.0)
MCHC: 33.5 g/dL (ref 31.5–35.7)
MCV: 96 fL (ref 79–97)
Monocytes Absolute: 0.4 10*3/uL (ref 0.1–0.9)
Monocytes: 10 %
Neutrophils Absolute: 2.2 10*3/uL (ref 1.4–7.0)
Neutrophils: 59 %
Platelets: 154 10*3/uL (ref 150–450)
RBC: 4.26 x10E6/uL (ref 3.77–5.28)
RDW: 12 % (ref 11.7–15.4)
WBC: 3.7 10*3/uL (ref 3.4–10.8)

## 2020-07-15 ENCOUNTER — Encounter: Payer: Self-pay | Admitting: Internal Medicine

## 2020-07-15 DIAGNOSIS — U071 COVID-19: Secondary | ICD-10-CM | POA: Diagnosis not present

## 2020-07-15 DIAGNOSIS — Z20828 Contact with and (suspected) exposure to other viral communicable diseases: Secondary | ICD-10-CM | POA: Diagnosis not present

## 2020-07-29 ENCOUNTER — Other Ambulatory Visit: Payer: Self-pay

## 2020-07-29 ENCOUNTER — Ambulatory Visit
Admission: RE | Admit: 2020-07-29 | Discharge: 2020-07-29 | Disposition: A | Discharge status: Home / Self Care | Payer: 59 | Source: Ambulatory Visit | Attending: Internal Medicine | Admitting: Internal Medicine

## 2020-07-29 DIAGNOSIS — M5431 Sciatica, right side: Secondary | ICD-10-CM | POA: Insufficient documentation

## 2020-07-29 DIAGNOSIS — M5432 Sciatica, left side: Secondary | ICD-10-CM | POA: Diagnosis not present

## 2020-07-29 DIAGNOSIS — R200 Anesthesia of skin: Secondary | ICD-10-CM | POA: Insufficient documentation

## 2020-07-29 DIAGNOSIS — M5416 Radiculopathy, lumbar region: Secondary | ICD-10-CM | POA: Insufficient documentation

## 2020-07-29 DIAGNOSIS — M545 Low back pain, unspecified: Secondary | ICD-10-CM | POA: Diagnosis not present

## 2020-07-29 DIAGNOSIS — R202 Paresthesia of skin: Secondary | ICD-10-CM | POA: Insufficient documentation

## 2020-08-07 DIAGNOSIS — M5451 Vertebrogenic low back pain: Secondary | ICD-10-CM | POA: Diagnosis not present

## 2020-08-07 DIAGNOSIS — M5417 Radiculopathy, lumbosacral region: Secondary | ICD-10-CM | POA: Diagnosis not present

## 2020-08-07 DIAGNOSIS — R262 Difficulty in walking, not elsewhere classified: Secondary | ICD-10-CM | POA: Diagnosis not present

## 2020-08-12 DIAGNOSIS — R262 Difficulty in walking, not elsewhere classified: Secondary | ICD-10-CM | POA: Diagnosis not present

## 2020-08-12 DIAGNOSIS — M5451 Vertebrogenic low back pain: Secondary | ICD-10-CM | POA: Diagnosis not present

## 2020-08-12 DIAGNOSIS — M5417 Radiculopathy, lumbosacral region: Secondary | ICD-10-CM | POA: Diagnosis not present

## 2020-08-13 ENCOUNTER — Telehealth: Payer: Self-pay

## 2020-08-13 NOTE — Telephone Encounter (Signed)
Faxed signed summary eval to Verdie Drown for PT eval [phone] on 08/13/20

## 2020-08-22 ENCOUNTER — Encounter: Payer: Self-pay | Admitting: Internal Medicine

## 2020-08-22 DIAGNOSIS — K219 Gastro-esophageal reflux disease without esophagitis: Secondary | ICD-10-CM

## 2020-08-22 DIAGNOSIS — M79671 Pain in right foot: Secondary | ICD-10-CM

## 2020-08-22 DIAGNOSIS — M25552 Pain in left hip: Secondary | ICD-10-CM

## 2020-08-22 DIAGNOSIS — M25551 Pain in right hip: Secondary | ICD-10-CM

## 2020-08-22 DIAGNOSIS — G8929 Other chronic pain: Secondary | ICD-10-CM

## 2020-08-22 DIAGNOSIS — M5442 Lumbago with sciatica, left side: Secondary | ICD-10-CM

## 2020-08-26 ENCOUNTER — Encounter: Payer: Self-pay | Admitting: Internal Medicine

## 2020-08-26 DIAGNOSIS — R937 Abnormal findings on diagnostic imaging of other parts of musculoskeletal system: Secondary | ICD-10-CM | POA: Insufficient documentation

## 2020-08-26 MED ORDER — PANTOPRAZOLE SODIUM 40 MG PO TBEC: 40.0000 mg | DELAYED_RELEASE_TABLET | Freq: Every day | ORAL | 1 refills | Status: DC

## 2020-08-26 MED ORDER — MELOXICAM 7.5 MG PO TABS: 7.5000 mg | ORAL_TABLET | Freq: Every day | ORAL | 0 refills | Status: DC

## 2020-08-26 NOTE — Telephone Encounter (Signed)
Pantoprazole has been refilled 

## 2020-09-18 ENCOUNTER — Telehealth (INDEPENDENT_AMBULATORY_CARE_PROVIDER_SITE_OTHER): Payer: 59 | Admitting: Internal Medicine

## 2020-09-18 ENCOUNTER — Encounter: Payer: Self-pay | Admitting: Internal Medicine

## 2020-09-18 ENCOUNTER — Other Ambulatory Visit: Payer: Self-pay

## 2020-09-18 VITALS — Ht 65.67 in | Wt 117.0 lb

## 2020-09-18 DIAGNOSIS — B373 Candidiasis of vulva and vagina: Secondary | ICD-10-CM | POA: Diagnosis not present

## 2020-09-18 DIAGNOSIS — B3731 Acute candidiasis of vulva and vagina: Secondary | ICD-10-CM

## 2020-09-18 DIAGNOSIS — J029 Acute pharyngitis, unspecified: Secondary | ICD-10-CM | POA: Diagnosis not present

## 2020-09-18 MED ORDER — FLUCONAZOLE 150 MG PO TABS: 150.0000 mg | ORAL_TABLET | Freq: Once | ORAL | 0 refills | Status: AC

## 2020-09-18 MED ORDER — AZITHROMYCIN 250 MG PO TABS
ORAL_TABLET | ORAL | 0 refills | Status: AC
Start: 2020-09-18 — End: 2020-09-23

## 2020-09-18 NOTE — Progress Notes (Signed)
Multiple people at work having symptoms, works in assisted living.  No history of allergies or sinus infections.  Symptoms onset Friday. Took 2 rapid COVID test, on Sunday and today, that were both negative.  Clear mucous with runny nose, cough, sore throat, coughing up yellow phlegm, fatigue.

## 2020-09-18 NOTE — Progress Notes (Signed)
Virtual Visit via Video Note  I connected with Stacey [name]  on 09/18/20 at  1:30 PM EDT by a video enabled telemedicine application and verified that I am speaking with the correct person using two identifiers.  Location patient: work Environmental [employer] or home office Persons participating in the virtual visit: patient, provider  I discussed the limitations of evaluation and management by telemedicine and the availability of in person appointments. The patient expressed understanding and agreed to proceed.   HPI: Sick visit Friday night and Saturday worse with cough with yellow pheglm, runny nose sore throat with white patches x 1 then went 8 patches and sore throat, fatigue, denies fever, bodyaches. Tried nasal saline, dayquil, nyquil x 4 days nasal discharge is clear, tried cepacol but burned and halls cough drops with honey,, soft foods Not tried otc AH yet   -COVID-19 vaccine status:3/3 ROS: See pertinent positives and negatives per HPI.  Past Medical History:  Diagnosis Date  . Bleeding from the nose    nose cauterized 1980s  . GERD (gastroesophageal reflux disease)   . IBS (irritable bowel syndrome)   . Kidney cysts   . Meniere disease    left hearing loss 2012   . Pelvic congestion syndrome     Past Surgical History:  Procedure Laterality Date  . ABDOMINAL HYSTERECTOMY     ovaries intact 2017 2/2 DUB  . ABLATION     DUB 10/29/04  . BREAST BIOPSY Right 2015   benign  . BREAST EXCISIONAL BIOPSY Right 2010   benign  . BREAST SURGERY     bx in 2017/2018 neg dense breast; clip placement right breast, 2008 fibroadenoma breast bx  . CHOLECYSTECTOMY     2015  . DILATION AND CURETTAGE OF UTERUS    . LAPAROSCOPY ABDOMEN DIAGNOSTIC    . TONSILLECTOMY     1983  . TUBAL LIGATION    . WISDOM TOOTH EXTRACTION     1989     Current Outpatient Medications:  .  Ascorbic Acid (VITAMIN C PO), Take by mouth., Disp: , Rfl:  .  azithromycin (ZITHROMAX) 250 MG tablet,  Take 2 tablets on day 1, then 1 tablet daily on days 2 through 5 with food, Disp: 6 tablet, Rfl: 0 .  B Complex Vitamins (B COMPLEX 1 PO), Take by mouth., Disp: , Rfl:  .  calcium carbonate (OS-CAL) 600 MG tablet, Take 600 mg by mouth daily., Disp: , Rfl:  .  Cholecalciferol (CVS D3) 50 MCG (2000 UT) CAPS, Take by mouth., Disp: , Rfl:  .  cholestyramine (QUESTRAN) 4 g packet, USE 1 PACKET MIXED AS DIRECTED DAILY, Disp: 180 each, Rfl: 3 .  clobetasol cream (TEMOVATE) 1.16 %, Apply 1 application topically 2 (two) times daily. Prn elbows, Disp: 60 g, Rfl: 2 .  COLLAGEN PO, Take by mouth., Disp: , Rfl:  .  cyclobenzaprine (FLEXERIL) 5 MG tablet, Take 1 tablet (5 mg total) by mouth at bedtime as needed for muscle spasms., Disp: 30 tablet, Rfl: 5 .  ECHINACEA EXTRACT PO, Take 750 mg by mouth daily., Disp: , Rfl:  .  ELDERBERRY PO, Take 50 mg by mouth daily., Disp: , Rfl:  .  fluconazole (DIFLUCAN) 150 MG tablet, Take 1 tablet (150 mg total) by mouth once for 1 dose. Repeat 2nd dose in 3 days if needed, Disp: 2 tablet, Rfl: 0 .  FOLIC ACID PO, Take by mouth., Disp: , Rfl:  .  GLUCOSAMINE HCL PO, Take by mouth., Disp: ,  Rfl:  .  Lysine 1000 MG TABS, Take 1 tablet by mouth daily., Disp: , Rfl:  .  meloxicam (MOBIC) 7.5 MG tablet, Take 1 tablet (7.5 mg total) by mouth daily., Disp: 90 tablet, Rfl: 0 .  pantoprazole (PROTONIX) 40 MG tablet, Take 1 tablet (40 mg total) by mouth daily. 30 minutes before food, Disp: 90 tablet, Rfl: 1 .  Probiotic Product (PROBIOTIC PO), Take by mouth., Disp: , Rfl:  .  Turmeric (QC TUMERIC COMPLEX PO), Take by mouth., Disp: , Rfl:  .  zinc gluconate 50 MG tablet, Take 50 mg by mouth daily., Disp: , Rfl:  .  Citicoline (COGNITIVE HEALTH PO), Take by mouth. cognium (Patient not taking: Reported on 09/18/2020), Disp: , Rfl:  .  Ginger, Zingiber officinalis, (GINGER PO), Take by mouth. (Patient not taking: Reported on 09/18/2020), Disp: , Rfl:   EXAM:  VITALS per patient if  applicable:  GENERAL: alert, oriented, appears well and in no acute distress  HEENT: atraumatic, conjunttiva clear, no obvious abnormalities on inspection of external nose and ears  NECK: normal movements of the head and neck  LUNGS: on inspection no signs of respiratory distress, breathing rate appears normal, no obvious gross SOB, gasping or wheezing  CV: no obvious cyanosis  MS: moves all visible extremities without noticeable abnormality  PSYCH/NEURO: pleasant and cooperative, no obvious depression or anxiety, speech and thought processing grossly intact  ASSESSMENT AND PLAN:  Discussed the following assessment and plan:  Sore throat - Plan: azithromycin (ZITHROMAX) 250 MG tablet  Yeast vaginitis - Plan: fluconazole (DIFLUCAN) 150 MG tablet  HM Flu shot utd Prev discTdap no recent in old PCP notes MMR immune  rec hep B vaccine Consider shingrix age 22 y.odiscto consider covid 19 x 3/3 moderna   Consider check hep C next labs disc with patientin future  Pap had 05/2017 get recordsobtained 05/20/15 normal neg HPVno h/o abnormal pap s/physterectomywith b/l ovaries intact  mammo7/13/20 negative -referred mammogram has right breast clip h/o breast calcifications, h/o fibroadenoma  -11/22/19 neg ordered 11/2020   Right breast bx 03/09/2007 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 so due to size excised path fibrocystic changes fibrous mastopathy, columnar cell change w/o atypia focal sclerosis adenosis    Egd/colonoscopy had 2017 get records -per notes colonoscopy in 08/2006 and 12/2012 2/2 diarrhea + internal hemorrhoids bxs negative for colitis  -EGD/colonoscopy 09/02/06 mild distal esophagitis/mild gastritis, small hiatal hernia; no polyps or colitis -repeat colonoscopy due 12/2022  Skinreferred Dr. Kellie Moor  Never smoker  HIV neg 09/29/16 rec healthy diet and exercise   -we discussed possible serious and likely etiologies, options for  evaluation and workup, limitations of telemedicine visit vs in person visit, treatment, treatment risks and precautions.   Advised to seek prompt in person care if worsening, new symptoms arise, or if is not improving with treatment. Discussed options for inperson care if PCP office not available. Did let this patient know that I only do telemedicine on Tuesdays and Thursdays for [place]. Advised to schedule follow up visit with PCP or [facility] if any further questions or concerns to avoid delays in care.   I discussed the assessment and treatment plan with the patient. The patient was provided an opportunity to ask questions and all were answered. The patient agreed with the plan and demonstrated an understanding of the instructions.    Time spent 20 min Delorise Jackson, MD

## 2020-09-20 ENCOUNTER — Encounter: Payer: Self-pay | Admitting: Internal Medicine

## 2020-11-22 ENCOUNTER — Other Ambulatory Visit: Payer: Self-pay

## 2020-11-22 ENCOUNTER — Ambulatory Visit
Admission: RE | Admit: 2020-11-22 | Discharge: 2020-11-22 | Disposition: A | Discharge status: Home / Self Care | Payer: 59 | Source: Ambulatory Visit | Attending: Internal Medicine | Admitting: Internal Medicine

## 2020-11-22 DIAGNOSIS — Z1231 Encounter for screening mammogram for malignant neoplasm of breast: Secondary | ICD-10-CM | POA: Diagnosis not present

## 2020-12-30 ENCOUNTER — Other Ambulatory Visit: Payer: Self-pay | Admitting: Internal Medicine

## 2020-12-30 DIAGNOSIS — M25551 Pain in right hip: Secondary | ICD-10-CM

## 2020-12-30 DIAGNOSIS — M25552 Pain in left hip: Secondary | ICD-10-CM

## 2020-12-30 DIAGNOSIS — M79671 Pain in right foot: Secondary | ICD-10-CM

## 2020-12-30 DIAGNOSIS — G8929 Other chronic pain: Secondary | ICD-10-CM

## 2020-12-30 DIAGNOSIS — M5442 Lumbago with sciatica, left side: Secondary | ICD-10-CM

## 2020-12-31 MED ORDER — MELOXICAM 7.5 MG PO TABS: 7.5000 mg | ORAL_TABLET | Freq: Every day | ORAL | 0 refills | Status: DC

## 2021-02-18 ENCOUNTER — Ambulatory Visit: Payer: 59 | Admitting: Internal Medicine

## 2021-02-20 ENCOUNTER — Other Ambulatory Visit: Payer: Self-pay | Admitting: Internal Medicine

## 2021-02-20 DIAGNOSIS — K219 Gastro-esophageal reflux disease without esophagitis: Secondary | ICD-10-CM

## 2021-02-28 ENCOUNTER — Ambulatory Visit: Payer: 59 | Admitting: Internal Medicine

## 2021-04-07 ENCOUNTER — Other Ambulatory Visit: Payer: Self-pay | Admitting: Internal Medicine

## 2021-04-07 DIAGNOSIS — M79671 Pain in right foot: Secondary | ICD-10-CM

## 2021-04-07 DIAGNOSIS — G8929 Other chronic pain: Secondary | ICD-10-CM

## 2021-04-07 DIAGNOSIS — M25552 Pain in left hip: Secondary | ICD-10-CM

## 2021-04-08 MED ORDER — MELOXICAM 7.5 MG PO TABS: 7.5000 mg | ORAL_TABLET | Freq: Every day | ORAL | 0 refills | Status: DC

## 2021-07-03 ENCOUNTER — Other Ambulatory Visit: Payer: Self-pay | Admitting: Internal Medicine

## 2021-07-03 DIAGNOSIS — M25552 Pain in left hip: Secondary | ICD-10-CM

## 2021-07-03 DIAGNOSIS — M79671 Pain in right foot: Secondary | ICD-10-CM

## 2021-07-03 DIAGNOSIS — M5442 Lumbago with sciatica, left side: Secondary | ICD-10-CM

## 2021-07-03 DIAGNOSIS — G8929 Other chronic pain: Secondary | ICD-10-CM

## 2021-07-03 DIAGNOSIS — M25551 Pain in right hip: Secondary | ICD-10-CM

## 2021-07-08 ENCOUNTER — Ambulatory Visit (INDEPENDENT_AMBULATORY_CARE_PROVIDER_SITE_OTHER): Payer: BC Managed Care – PPO | Admitting: Internal Medicine

## 2021-07-08 ENCOUNTER — Other Ambulatory Visit: Payer: Self-pay

## 2021-07-08 ENCOUNTER — Encounter: Payer: Self-pay | Admitting: Internal Medicine

## 2021-07-08 VITALS — BP 112/68 | HR 66 | Temp 98.0°F | Ht 65.35 in | Wt 118.2 lb

## 2021-07-08 DIAGNOSIS — Z23 Encounter for immunization: Secondary | ICD-10-CM

## 2021-07-08 DIAGNOSIS — Z1389 Encounter for screening for other disorder: Secondary | ICD-10-CM

## 2021-07-08 DIAGNOSIS — L409 Psoriasis, unspecified: Secondary | ICD-10-CM

## 2021-07-08 DIAGNOSIS — M2550 Pain in unspecified joint: Secondary | ICD-10-CM

## 2021-07-08 DIAGNOSIS — D696 Thrombocytopenia, unspecified: Secondary | ICD-10-CM

## 2021-07-08 DIAGNOSIS — D72819 Decreased white blood cell count, unspecified: Secondary | ICD-10-CM

## 2021-07-08 DIAGNOSIS — Z0000 Encounter for general adult medical examination without abnormal findings: Secondary | ICD-10-CM

## 2021-07-08 DIAGNOSIS — Z1231 Encounter for screening mammogram for malignant neoplasm of breast: Secondary | ICD-10-CM | POA: Diagnosis not present

## 2021-07-08 DIAGNOSIS — E559 Vitamin D deficiency, unspecified: Secondary | ICD-10-CM | POA: Diagnosis not present

## 2021-07-08 MED ORDER — TETANUS-DIPHTH-ACELL PERTUSSIS 5-2.5-18.5 LF-MCG/0.5 IM SUSP: 0.5000 mL | Freq: Once | INTRAMUSCULAR | 0 refills | Status: AC

## 2021-07-08 NOTE — Addendum Note (Signed)
Addended by: Quentin Ore on: 07/08/2021 03:47 PM   Modules accepted: Orders

## 2021-07-08 NOTE — Progress Notes (Signed)
Chief Complaint  Patient presents with   Annual Exam   HPI ROS Past Medical History:  Diagnosis Date   Bleeding from the nose    nose cauterized 1980s   GERD (gastroesophageal reflux disease)    IBS (irritable bowel syndrome)    Kidney cysts    Meniere disease    left hearing loss 2012    Pelvic congestion syndrome    Past Surgical History:  Procedure Laterality Date   ABDOMINAL HYSTERECTOMY     ovaries intact 2017 2/2 DUB   ABLATION     DUB 10/29/04   BREAST BIOPSY Right 2015   benign   BREAST EXCISIONAL BIOPSY Right 2010   benign   BREAST SURGERY     bx in 2017/2018 neg dense breast; clip placement right breast, 2008 fibroadenoma breast bx   CHOLECYSTECTOMY     2015   DILATION AND CURETTAGE OF UTERUS     LAPAROSCOPY ABDOMEN DIAGNOSTIC     TONSILLECTOMY     1983   TUBAL LIGATION     WISDOM TOOTH EXTRACTION     1989   Family History  Problem Relation Age of Onset   Arthritis Mother    Hypertension Mother    Arthritis Father    Hypertension Father    Diabetes Father    Hearing loss Father    Hyperlipidemia Father    Cancer Paternal Aunt        gu cancer ? type-endometrial   Rheumatic fever Paternal Aunt    Mental illness Paternal Aunt        cognitive impairment   Arthritis Maternal Grandmother    Diabetes Maternal Grandmother    Hyperlipidemia Maternal Grandmother    Hypertension Maternal Grandmother    Arthritis Maternal Grandfather    Hyperlipidemia Maternal Grandfather    Hypertension Maternal Grandfather    Arthritis Paternal Grandmother    Diabetes Paternal Grandmother    Hyperlipidemia Paternal Grandmother    Hypertension Paternal Grandmother    Arthritis Paternal Grandfather    Hyperlipidemia Paternal Grandfather    Hypertension Paternal Grandfather    Arthritis Sister    Miscarriages / [place] Sister    Deep vein thrombosis Sister    Scoliosis Sister    Breast cancer Neg Hx    Social History   Socioeconomic History   Marital  status: Married    Spouse name: Not on file   Number of children: Not on file   Years of education: Not on file   Highest education level: Not on file  Occupational History   Not on file  Tobacco Use   Smoking status: Never   Smokeless tobacco: Never  Substance and Sexual Activity   Alcohol use: Yes   Drug use: Not Currently   Sexual activity: Yes    Partners: Male  Other Topics Concern   Not on file  Social History Narrative   Moved from [place] 2019    2 kids girl and boy born 1995 and [place] 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       No guns    Wears seat belt    Safe in relationship    Never smoker    Social Determinants of Radio broadcast assistant Strain: Not on file  Food Insecurity: Not on file  Transportation Needs: Not on file  Physical Activity: Not on file  Stress: Not on file  Social Connections: Not on file  Intimate Partner Violence: Not on file   Current Meds  Medication Sig   calcium carbonate (OS-CAL) 600 MG tablet Take 600 mg by mouth daily.   Cholecalciferol (CVS D3) 50 MCG (2000 UT) CAPS Take by mouth.   clobetasol cream (TEMOVATE) 2.99 % Apply 1 application topically 2 (two) times daily. Prn elbows   COLLAGEN PO Take by mouth.   ELDERBERRY PO Take 50 mg by mouth daily.   GLUCOSAMINE HCL PO Take by mouth.   meloxicam (MOBIC) 7.5 MG tablet Take 1 tablet (7.5 mg total) by mouth daily as needed.   Multiple Vitamin (MULTI-VITAMINS PO)    pantoprazole (PROTONIX) 40 MG tablet TAKE 1 TABLET BY MOUTH DAILY, 30 MINUTES BEFORE FOOD.   Probiotic Product (PROBIOTIC PO) Take by mouth.   Allergies  Allergen Reactions   Other     Dairy and peanuts    No results found for this or any previous visit (from the past 2160 hour(s)). Objective  Body mass index is 19.46 kg/m. Wt Readings from Last 3 Encounters:  07/08/21 118 lb 3.2 oz (53.6 kg)  09/18/20 117 lb (53.1 kg)  07/05/20 119 lb (54 kg)   Temp Readings from  Last 3 Encounters:  07/08/21 98 F (36.7 C) (Oral)  07/05/20 97.8 F (36.6 C) (Oral)  02/09/20 97.8 F (36.6 C) (Oral)   BP Readings from Last 3 Encounters:  07/08/21 112/68  07/05/20 106/70  02/09/20 130/70   Pulse Readings from Last 3 Encounters:  07/08/21 66  07/05/20 71  02/09/20 67    Physical Exam  Assessment  Plan  Annual physical exam   HM Flu shot utd Prev disc Tdap  no recent in old PCP notes  MMR immune  rec hep B vaccine Declines shingrix  covid 19 x 5/5 moderna    Consider check hep C  declines    Pap had 05/2017 get records obtained 05/20/15 normal neg HPV no h/o abnormal pap s/p hysterectomy with b/l ovaries intact    mammo 11/21/18 negative  -referred mammogram has right breast clip h/o breast calcifications, h/o fibroadenoma  -11/22/19 neg ordered 11/2020 negative ordered     Right breast bx 03/09/2007 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 so due to size excised path fibrocystic changes fibrous mastopathy, columnar cell change w/o atypia focal sclerosis adenosis      Egd/colonoscopy had 2017 get records  -per notes colonoscopy in 08/2006 and 12/2012 2/2 diarrhea + internal hemorrhoids bxs negative for colitis  -EGD/colonoscopy 09/02/06 mild distal esophagitis/mild gastritis, small hiatal hernia; no polyps or colitis  -repeat colonoscopy due 12/2022    Skin referred Dr. Kellie Moor   Never smoker  HIV neg 09/29/16  rec healthy diet and exercise    Provider: Dr. Olivia Mackie McLean-Scocuzza-Internal Medicine

## 2021-07-08 NOTE — Patient Instructions (Signed)
[facility] Dr. Dierdre Forth or Azucena Fallen   [place] Address: [address] [place] [address], [place], [place] [address] Hours:  Closes soon ? 4:30?PM ? Opens 8?AM Wed Phone: [phone]

## 2021-07-09 ENCOUNTER — Other Ambulatory Visit: Payer: Self-pay

## 2021-07-09 ENCOUNTER — Other Ambulatory Visit (INDEPENDENT_AMBULATORY_CARE_PROVIDER_SITE_OTHER): Payer: BC Managed Care – PPO

## 2021-07-09 DIAGNOSIS — Z0000 Encounter for general adult medical examination without abnormal findings: Secondary | ICD-10-CM

## 2021-07-09 LAB — LIPID PANEL
Cholesterol: 175 mg/dL (ref 0–200)
HDL: 72.5 mg/dL (ref >39.00)
LDL Cholesterol: 91 mg/dL (ref 0–99)
NonHDL: 102.24
Total CHOL/HDL Ratio: 2
Triglycerides: 57 mg/dL (ref 0.0–149.0)
VLDL: 11.4 mg/dL (ref 0.0–40.0)

## 2021-07-09 LAB — CBC WITH DIFFERENTIAL/PLATELET
Basophils Absolute: 0 10*3/uL (ref 0.0–0.1)
Basophils Relative: 1.4 % (ref 0.0–3.0)
Eosinophils Absolute: 0 10*3/uL (ref 0.0–0.7)
Eosinophils Relative: 1 % (ref 0.0–5.0)
HCT: 39.8 % (ref 36.0–46.0)
Hemoglobin: 13.4 g/dL (ref 12.0–15.0)
Lymphocytes Relative: 35.2 % (ref 12.0–46.0)
Lymphs Abs: 1.2 10*3/uL (ref 0.7–4.0)
MCHC: 33.7 g/dL (ref 30.0–36.0)
MCV: 93.7 fl (ref 78.0–100.0)
Monocytes Absolute: 0.3 10*3/uL (ref 0.1–1.0)
Monocytes Relative: 9.4 % (ref 3.0–12.0)
Neutro Abs: 1.8 10*3/uL (ref 1.4–7.7)
Neutrophils Relative %: 53 % (ref 43.0–77.0)
Platelets: 146 10*3/uL — ABNORMAL LOW (ref 150.0–400.0)
RBC: 4.25 Mil/uL (ref 3.87–5.11)
RDW: 12.7 % (ref 11.5–15.5)
WBC: 3.4 10*3/uL — ABNORMAL LOW (ref 4.0–10.5)

## 2021-07-09 LAB — COMPREHENSIVE METABOLIC PANEL
ALT: 12 U/L (ref 0–35)
AST: 18 U/L (ref 0–37)
Albumin: 4.4 g/dL (ref 3.5–5.2)
Alkaline Phosphatase: 29 U/L — ABNORMAL LOW (ref 39–117)
BUN: 14 mg/dL (ref 6–23)
CO2: 29 mEq/L (ref 19–32)
Calcium: 9.4 mg/dL (ref 8.4–10.5)
Chloride: 103 mEq/L (ref 96–112)
Creatinine, Ser: 0.99 mg/dL (ref 0.40–1.20)
GFR: 65.8 mL/min (ref >60.00)
Glucose, Bld: 90 mg/dL (ref 70–99)
Potassium: 4 mEq/L (ref 3.5–5.1)
Sodium: 139 mEq/L (ref 135–145)
Total Bilirubin: 0.8 mg/dL (ref 0.2–1.2)
Total Protein: 6.8 g/dL (ref 6.0–8.3)

## 2021-07-09 LAB — VITAMIN D 25 HYDROXY (VIT D DEFICIENCY, FRACTURES): VITD: 63.66 ng/mL (ref 30.00–100.00)

## 2021-07-09 LAB — TSH: TSH: 1.75 u[IU]/mL (ref 0.35–5.50)

## 2021-07-10 ENCOUNTER — Encounter: Payer: Self-pay | Admitting: Internal Medicine

## 2021-07-10 LAB — URINALYSIS, ROUTINE W REFLEX MICROSCOPIC
Bacteria, UA: NONE SEEN /HPF
Bilirubin Urine: NEGATIVE
Glucose, UA: NEGATIVE
Hgb urine dipstick: NEGATIVE
Hyaline Cast: NONE SEEN /LPF
Ketones, ur: NEGATIVE
Nitrite: NEGATIVE
Protein, ur: NEGATIVE
RBC / HPF: NONE SEEN /HPF (ref 0–2)
Specific Gravity, Urine: 1.015 (ref 1.001–1.035)
Squamous Epithelial / HPF: NONE SEEN /HPF (ref <=5)
pH: 8 (ref 5.0–8.0)

## 2021-07-10 NOTE — Telephone Encounter (Signed)
-----   Message from Bevelyn Buckles, MD sent at 07/10/2021  8:22 AM EST ----- ?White blood cells and platelets slightly low  ?This happened 1 year ago  ?-does she want to see hematology to further work this up in [place]  ?? ?Liver kidneys normal  ?? ?? ?Vitamin D normal  ?? ?Thyroid lab normal  ?Cholesterol normal  ?? ?Urine some white blood cells not worried about this  ? ?

## 2021-07-10 NOTE — Addendum Note (Signed)
Addended by: Orland Mustard on: 07/10/2021 06:41 PM   Modules accepted: Orders

## 2021-07-11 ENCOUNTER — Telehealth: Payer: Self-pay | Admitting: Hematology

## 2021-07-11 NOTE — Telephone Encounter (Signed)
Scheduled appt per 3/2 referral. Pt is aware of appt date and time. Pt is aware to arrive 15 mins prior to appt time and to bring and updated insurance card. Pt is aware of appt location.   ?

## 2021-07-24 ENCOUNTER — Encounter: Payer: Self-pay | Admitting: Internal Medicine

## 2021-07-29 ENCOUNTER — Inpatient Hospital Stay: Payer: BC Managed Care – PPO | Attending: Hematology | Admitting: Hematology

## 2021-07-29 ENCOUNTER — Other Ambulatory Visit: Payer: Self-pay

## 2021-07-29 DIAGNOSIS — Z90710 Acquired absence of both cervix and uterus: Secondary | ICD-10-CM | POA: Insufficient documentation

## 2021-07-29 DIAGNOSIS — D72819 Decreased white blood cell count, unspecified: Secondary | ICD-10-CM | POA: Diagnosis not present

## 2021-07-29 DIAGNOSIS — Z809 Family history of malignant neoplasm, unspecified: Secondary | ICD-10-CM | POA: Insufficient documentation

## 2021-07-29 DIAGNOSIS — D696 Thrombocytopenia, unspecified: Secondary | ICD-10-CM | POA: Diagnosis not present

## 2021-07-29 NOTE — Progress Notes (Addendum)
? ?CONSULT NOTE ? ?Patient Care Team: ?McLean-Scocuzza, Pasty Spillersracy N, MD as PCP - General (Internal Medicine) ? ?CHIEF COMPLAINTS/PURPOSE OF CONSULTATION:  ?Consultation and Management of thrombocytopenia and leukopenia ? ?HISTORY OF PRESENTING ILLNESS:  ? ?Stacey IsaacDeborah [name] 51 y.o. female is here for Consultation and Management thrombocytopenia and leukopenia. ? ?She was found to have abnormal CBC from 07/09/2021 with a WBC count of 3.4 with ANC of 1.8k.  Platelets of 146k and normal hemoglobin of 13.4 with an MCV of 93 . ?Lab review shows patient has had mild thrombocytopenia on and off since at least 2020 though patient notes it has been much longer than that.  Mild leukopenia on and off for more than 5 years per patient. ?She denies recent chest pain on exertion, shortness of breath on minimal exertion, pre-syncopal episodes, or palpitations. ? ?She had not noticed any recent bleeding such as epistaxis, hematuria or hematochezia ?The patient denies over the counter NSAID ingestion. She is not  on antiplatelets agents. ?Her last colonoscopy was 09/02/2006 ?She had no prior history or diagnosis of cancer. Her age appropriate screening programs are up-to-date. ?She denies any pica and eats a variety of diet. ?She never donated blood or received blood transfusion ? ?She is currently taking a multivitamin and we discussed taking a separate vitamin b complex. ? ?We discussed how fluctuations in blood counts can be related to autoimmune mechanisms in association with psoriasis or associated vitamin deficiencies. ? ?She reports having a lot of joint pain that is currently being managed by her PCP.  ? 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 ? ?She expresses that she tends to feel very cold. She says that she has maintained a stable weight throughout her life. ? ?She notes that she is not taking any other supplements currently and primarily uses over-the-counter. ? ?She  reports reflux causing chest pain when not taking Protonix. ? ?No fever, chills, or night sweats. ?No new lumps, bumps, or lesions. ?No unexpected weight loss. ? ?No tattoos. No unsafe sexual exposure. ? ?No history of smoking. ? ?Labs done 07/09/2021 were reviewed with pt. Platelets 146k which is down from labs done a year ago. ? ? ?MEDICAL HISTORY:  ?Past Medical History:  ?Diagnosis Date  ? Bleeding from the nose   ? nose cauterized 1980s  ? GERD (gastroesophageal reflux disease)   ? IBS (irritable bowel syndrome)   ? Kidney cysts   ? Meniere disease   ? left hearing loss 2012   ? Pelvic congestion syndrome   ? ? ?SURGICAL HISTORY: ?Past Surgical History:  ?Procedure Laterality Date  ? ABDOMINAL HYSTERECTOMY    ? ovaries intact 2017 2/2 DUB  ? ABLATION    ? DUB 10/29/04  ? BREAST BIOPSY Right 2015  ? benign  ? BREAST EXCISIONAL BIOPSY Right 2010  ? benign  ? BREAST SURGERY    ? bx in 2017/2018 neg dense breast; clip placement right breast, 2008 fibroadenoma breast bx  ? CHOLECYSTECTOMY    ? 2015  ? DILATION AND CURETTAGE OF UTERUS    ? LAPAROSCOPY ABDOMEN DIAGNOSTIC    ? TONSILLECTOMY    ? 1983  ? TUBAL LIGATION    ? WISDOM TOOTH EXTRACTION    ? 1989  ? ? ?SOCIAL HISTORY: ?Social History  ? ?Socioeconomic History  ? Marital status: Married  ?  Spouse name: Not on file  ? Number of children: Not on file  ?  Years of education: Not on file  ? Highest education level: Not on file  ?Occupational History  ? Not on file  ?Tobacco Use  ? Smoking status: Never  ? Smokeless tobacco: Never  ?Substance and Sexual Activity  ? Alcohol use: Yes  ? Drug use: Not Currently  ? Sexual activity: Yes  ?  Partners: Male  ?Other Topics Concern  ? Not on file  ?Social History Narrative  ? Moved from [place] 2019   ? 2 kids girl and boy born 86 and 1997   ? Diploma trade certificate   ? 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 [facility]   ?   ? No guns   ? Wears seat belt   ? Safe in relationship   ? Never smoker   ? ?Social Determinants of  Health  ? ?Financial Resource Strain: Not on file  ?Food Insecurity: Not on file  ?Transportation Needs: Not on file  ?Physical Activity: Not on file  ?Stress: Not on file  ?Social Connections: Not on file  ?Intimate Partner Violence: Not on file  ? ? ?FAMILY HISTORY: ?Family History  ?Problem Relation Age of Onset  ? Arthritis Mother   ? Hypertension Mother   ? Arthritis Father   ? Hypertension Father   ? Diabetes Father   ? Hearing loss Father   ? Hyperlipidemia Father   ? Cancer Paternal Aunt   ?     gu cancer ? type-endometrial  ? Rheumatic fever Paternal Aunt   ? Mental illness Paternal Aunt   ?     cognitive impairment  ? Arthritis Maternal Grandmother   ? Diabetes Maternal Grandmother   ? Hyperlipidemia Maternal Grandmother   ? Hypertension Maternal Grandmother   ? Arthritis Maternal Grandfather   ? Hyperlipidemia Maternal Grandfather   ? Hypertension Maternal Grandfather   ? Arthritis Paternal Grandmother   ? Diabetes Paternal Grandmother   ? Hyperlipidemia Paternal Grandmother   ? Hypertension Paternal Grandmother   ? Arthritis Paternal Grandfather   ? Hyperlipidemia Paternal Grandfather   ? Hypertension Paternal Grandfather   ? Arthritis Sister   ? Miscarriages / Stillbirths Sister   ? Deep vein thrombosis Sister   ? Scoliosis Sister   ? Breast cancer Neg Hx   ? ? ?ALLERGIES:  is allergic to other. ? ?MEDICATIONS:  ?Current Outpatient Medications  ?Medication Sig Dispense Refill  ? calcium carbonate (OS-CAL) 600 MG tablet Take 600 mg by mouth daily.    ? Cholecalciferol ([employer]S D3) 50 MCG (2000 UT) CAPS Take by mouth.    ? clobetasol cream (TEMOVATE) 0.05 % Apply 1 application topically 2 (two) times daily. Prn elbows 60 g 2  ? COLLAGEN PO Take by mouth.    ? ELDERBERRY PO Take 50 mg by mouth daily.    ? FOLIC ACID PO Take by mouth.    ? GLUCOSAMINE HCL PO Take by mouth.    ? meloxicam (MOBIC) 7.5 MG tablet Take 1 tablet (7.5 mg total) by mouth daily as needed. 90 tablet 1  ? Multiple Vitamin (MULTI-VITAMINS  PO)     ? pantoprazole (PROTONIX) 40 MG tablet TAKE 1 TABLET BY MOUTH DAILY, 30 MINUTES BEFORE FOOD. 90 tablet 1  ? Probiotic Product (PROBIOTIC PO) Take by mouth.    ? ?No current facility-administered medications for this visit.  ? ? ?REVIEW OF SYSTEMS:   ?Constitutional: Denies fevers, chills or abnormal night sweats ?Eyes: Denies blurriness of vision, double vision or watery eyes ?Ears,  nose, mouth, throat, and face: Denies mucositis or sore throat ?Respiratory: Denies cough, dyspnea or wheezes ?Cardiovascular: Denies palpitation, chest discomfort or lower extremity swelling ?Gastrointestinal:  Denies nausea, heartburn or change in bowel habits ?Skin: Denies abnormal skin rashes ?Lymphatics: Denies new lymphadenopathy or easy bruising ?Neurological:Denies numbness, tingling or new weaknesses ?Behavioral/Psych: Mood is stable, no new changes  ?All other systems were reviewed with the patient and are negative. ? ?PHYSICAL EXAMINATION: ?ECOG PERFORMANCE STATUS: 1 - Symptomatic but completely ambulatory ? ?NAD ?GENERAL:alert, no distress and comfortable ?SKIN: skin color, texture, turgor are normal, significant lesions. Psoriasis on elbows. ?EYES: normal, conjunctiva are pink and non-injected, sclera clear ?NECK: supple, thyroid normal size, non-tender, without nodularity ?LYMPH:  no palpable lymphadenopathy in the cervical, axillary or inguinal ?LUNGS: clear to auscultation and percussion with normal breathing effort ?HEART: regular rate & rhythm and no murmurs and no lower extremity edema ?ABDOMEN:abdomen soft, non-tender and normal bowel sounds ?Musculoskeletal:no cyanosis of digits and no clubbing  ?PSYCH: alert & oriented x 3 with fluent speech ?NEURO: no focal motor/sensory deficits ? ?LABORATORY DATA:  ? ?. ? ?  Latest Ref Rng & Units 07/09/2021  ?  8:06 AM 07/05/2020  ?  4:46 PM 07/05/2020  ?  9:27 AM  ?CBC  ?WBC 4.0 - 10.5 K/uL 3.4   3.7   3.6    ?Hemoglobin 12.0 - 15.0 g/dL 54.9   82.6   41.5    ?Hematocrit  36.0 - 46.0 % 39.8   40.9   39.4    ?Platelets 150.0 - 400.0 K/uL 146.0   154   147.0    ? ? ?  Latest Ref Rng & Units 07/09/2021  ?  8:06 AM 07/05/2020  ?  9:27 AM 07/27/2019  ?  7:20 AM  ?CMP  ?Glucose 70 - 99

## 2021-08-15 ENCOUNTER — Other Ambulatory Visit: Payer: Self-pay | Admitting: Internal Medicine

## 2021-08-15 DIAGNOSIS — K219 Gastro-esophageal reflux disease without esophagitis: Secondary | ICD-10-CM

## 2021-08-17 ENCOUNTER — Encounter: Payer: Self-pay | Admitting: Internal Medicine

## 2021-08-18 ENCOUNTER — Other Ambulatory Visit: Payer: Self-pay

## 2021-08-18 DIAGNOSIS — K219 Gastro-esophageal reflux disease without esophagitis: Secondary | ICD-10-CM

## 2021-08-18 DIAGNOSIS — L409 Psoriasis, unspecified: Secondary | ICD-10-CM

## 2021-08-18 MED ORDER — PANTOPRAZOLE SODIUM 40 MG PO TBEC: DELAYED_RELEASE_TABLET | ORAL | 1 refills | Status: DC

## 2021-08-18 MED ORDER — CLOBETASOL PROPIONATE 0.05 % EX CREA: 1.0000 "application " | TOPICAL_CREAM | Freq: Two times a day (BID) | CUTANEOUS | 2 refills | Status: AC

## 2021-10-30 ENCOUNTER — Telehealth: Payer: Self-pay | Admitting: Internal Medicine

## 2021-10-30 NOTE — Telephone Encounter (Signed)
Patient stated urinalysis was not covered by insurance due to coding . The patient is asking that the urinalysis be re-coded. He lab was coded as Z00.00

## 2021-11-24 DIAGNOSIS — M5136 Other intervertebral disc degeneration, lumbar region: Secondary | ICD-10-CM | POA: Diagnosis not present

## 2021-11-24 DIAGNOSIS — M2559 Pain in other specified joint: Secondary | ICD-10-CM | POA: Diagnosis not present

## 2021-11-24 DIAGNOSIS — L409 Psoriasis, unspecified: Secondary | ICD-10-CM | POA: Diagnosis not present

## 2021-12-23 ENCOUNTER — Ambulatory Visit
Admission: RE | Admit: 2021-12-23 | Discharge: 2021-12-23 | Disposition: A | Discharge status: Home / Self Care | Payer: BC Managed Care – PPO | Source: Ambulatory Visit | Attending: Internal Medicine | Admitting: Internal Medicine

## 2021-12-23 DIAGNOSIS — Z1231 Encounter for screening mammogram for malignant neoplasm of breast: Secondary | ICD-10-CM | POA: Insufficient documentation

## 2022-01-23 ENCOUNTER — Other Ambulatory Visit: Payer: Self-pay | Admitting: Internal Medicine

## 2022-01-23 DIAGNOSIS — M79671 Pain in right foot: Secondary | ICD-10-CM

## 2022-01-23 DIAGNOSIS — G8929 Other chronic pain: Secondary | ICD-10-CM

## 2022-01-23 DIAGNOSIS — M25552 Pain in left hip: Secondary | ICD-10-CM

## 2022-02-12 ENCOUNTER — Other Ambulatory Visit: Payer: Self-pay | Admitting: Internal Medicine

## 2022-02-12 DIAGNOSIS — K219 Gastro-esophageal reflux disease without esophagitis: Secondary | ICD-10-CM

## 2022-04-17 ENCOUNTER — Ambulatory Visit
Admission: EM | Admit: 2022-04-17 | Discharge: 2022-04-17 | Disposition: A | Discharge status: Home / Self Care | Payer: BC Managed Care – PPO | Attending: Urgent Care | Admitting: Urgent Care

## 2022-04-17 DIAGNOSIS — N3001 Acute cystitis with hematuria: Secondary | ICD-10-CM | POA: Diagnosis not present

## 2022-04-17 LAB — POCT URINALYSIS DIP (MANUAL ENTRY)
Bilirubin, UA: NEGATIVE
Glucose, UA: NEGATIVE mg/dL
Ketones, POC UA: NEGATIVE mg/dL
Nitrite, UA: NEGATIVE
Protein Ur, POC: NEGATIVE mg/dL
Spec Grav, UA: 1.02 (ref 1.010–1.025)
Urobilinogen, UA: 0.2 E.U./dL
pH, UA: 6.5 (ref 5.0–8.0)

## 2022-04-17 MED ORDER — FLUCONAZOLE 150 MG PO TABS: 150.0000 mg | ORAL_TABLET | Freq: Once | ORAL | 0 refills | Status: AC

## 2022-04-17 MED ORDER — NITROFURANTOIN MONOHYD MACRO 100 MG PO CAPS: 100.0000 mg | ORAL_CAPSULE | Freq: Two times a day (BID) | ORAL | 0 refills | Status: DC

## 2022-04-17 NOTE — ED Triage Notes (Signed)
Pt. Presents to UC w/ c/o urinary frequency since yesterday.

## 2022-04-17 NOTE — Discharge Instructions (Signed)
Follow up here or with your primary care provider if your symptoms are worsening or not improving with treatment.     

## 2022-04-17 NOTE — ED Provider Notes (Addendum)
Stacey [name]    CSN: [id number] Arrival date & time: 04/17/22  0935      History   Chief Complaint Chief Complaint  Patient presents with   Urinary Frequency    HPI Stacey [name] is a 51 y.o. female.    Urinary Frequency    Presents to urgent care with complaint of urinary frequency since yesterday.  Past Medical History:  Diagnosis Date   Bleeding from the nose    nose cauterized 1980s   GERD (gastroesophageal reflux disease)    IBS (irritable bowel syndrome)    Kidney cysts    Meniere disease    left hearing loss 2012    Pelvic congestion syndrome     Patient Active Problem List   Diagnosis Date Noted   Abnormal MRI, lumbar spine 08/26/2020   Annual physical exam 07/05/2019   Female pelvic congestion syndrome 09/21/2018   Thrombocytopenia (HCC) 09/21/2018   Leukopenia 09/21/2018   Joint pain 09/21/2018   Hot flashes 09/21/2018   History of cholecystectomy 09/21/2018   Breast calcifications 09/21/2018   IBS (irritable bowel syndrome) 04/19/2018   GERD (gastroesophageal reflux disease) 04/19/2018    Past Surgical History:  Procedure Laterality Date   ABDOMINAL HYSTERECTOMY     ovaries intact 2017 2/2 DUB   ABLATION     DUB 10/29/04   BREAST BIOPSY Right 2015   benign   BREAST EXCISIONAL BIOPSY Right 2010   benign   BREAST SURGERY     bx in 2017/2018 neg dense breast; clip placement right breast, 2008 fibroadenoma breast bx   CHOLECYSTECTOMY     2015   DILATION AND CURETTAGE OF UTERUS     LAPAROSCOPY ABDOMEN DIAGNOSTIC     [place]    OB History   No obstetric history on file.      Home Medications    Prior to Admission medications   Medication Sig Start Date End Date Taking? Authorizing Provider  calcium carbonate (OS-CAL) 600 MG tablet Take 600 mg by mouth daily.    [provider]  Cholecalciferol ([employer]S D3) 50 MCG (2000 UT) CAPS Take by  mouth.    [provider]  clobetasol cream (TEMOVATE) [id number] % Apply 1 application. topically 2 (two) times daily. Prn elbows 08/18/21   McLean-Scocuzza, Nino Glow, MD  COLLAGEN PO Take by mouth.    [provider]  ELDERBERRY PO Take 50 mg by mouth daily.    [provider]  FOLIC ACID PO Take by mouth.    [provider]  GLUCOSAMINE HCL PO Take by mouth.    [provider]  meloxicam (MOBIC) 7.5 MG tablet TAKE ONE TABLET BY MOUTH DAILY AS NEEDED 01/23/22   McLean-Scocuzza, Nino Glow, MD  Multiple Vitamin (MULTI-VITAMINS PO)  05/11/21   [provider]  pantoprazole (PROTONIX) 40 MG tablet TAKE ONE TABLET BY MOUTH DAILY 30 MINUTES BEFORE FOOD 02/12/22   McLean-Scocuzza, Nino Glow, MD  Probiotic Product (PROBIOTIC PO) Take by mouth.    [provider]    Family History Family History  Problem Relation Age of Onset   Arthritis Mother    Hypertension Mother    Arthritis Father    Hypertension Father    Diabetes Father    Hearing loss Father    Hyperlipidemia Father    Cancer Paternal Aunt  gu cancer ? type-endometrial   Rheumatic fever Paternal Aunt    Mental illness Paternal Aunt        cognitive impairment   Arthritis Maternal Grandmother    Diabetes Maternal Grandmother    Hyperlipidemia Maternal Grandmother    Hypertension Maternal Grandmother    Arthritis Maternal Grandfather    Hyperlipidemia Maternal Grandfather    Hypertension Maternal Grandfather    Arthritis Paternal Grandmother    Diabetes Paternal Grandmother    Hyperlipidemia Paternal Grandmother    Hypertension Paternal Grandmother    Arthritis Paternal Grandfather    Hyperlipidemia Paternal Grandfather    Hypertension Paternal Grandfather    Arthritis Sister    Miscarriages / [place] Sister    Deep vein thrombosis Sister    Scoliosis Sister    Breast cancer Neg Hx     Social History Social History   Tobacco Use   Smoking status: Never    Smokeless tobacco: Never  Substance Use Topics   Alcohol use: Yes   Drug use: Not Currently     Allergies   Other   Review of Systems Review of Systems  Genitourinary:  Positive for frequency.     Physical Exam Triage Vital Signs ED Triage Vitals  Enc Vitals Group     BP 04/17/22 1010 104/65     Pulse Rate 04/17/22 1010 77     Resp 04/17/22 1010 16     Temp 04/17/22 1010 98 F (36.7 C)     Temp src --      SpO2 04/17/22 1010 98 %     Weight --      Height --      Head Circumference --      Peak Flow --      Pain Score 04/17/22 1011 0     Pain Loc --      Pain Edu? --      Excl. in [place]? --    No data found.  Updated Vital Signs BP 104/65   Pulse 77   Temp 98 F (36.7 C)   Resp 16   LMP  (LMP Unknown)   SpO2 98%   Visual Acuity Right Eye Distance:   Left Eye Distance:   Bilateral Distance:    Right Eye Near:   Left Eye Near:    Bilateral Near:     Physical Exam Vitals reviewed.  Constitutional:      Appearance: Normal appearance.  Skin:    General: Skin is warm and dry.  Neurological:     General: No focal deficit present.     Mental Status: She is alert and oriented to person, place, and time.  Psychiatric:        Mood and Affect: Mood normal.        Behavior: Behavior normal.      UC Treatments / Results  Labs (all labs ordered are listed, but only abnormal results are displayed) Labs Reviewed  POCT URINALYSIS DIP (MANUAL ENTRY)    EKG   Radiology No results found.  Procedures Procedures (including critical care time)  Medications Ordered in UC Medications - No data to display  Initial Impression / Assessment and Plan / UC Course  I have reviewed the triage vital signs and the nursing notes.  Pertinent labs & imaging results that were available during my care of the patient were reviewed by me and considered in my medical decision making (see chart for details).   UA is significant only for trace leukocytes  and trace  blood.  Urine is cloudy.  Will treat empirically for acute cystitis with hematuria while sending sample to culture to confirm diagnosis and susceptibility to treatment.  Prescribed macrobid. Also gave diflucan at patient request.   Final Clinical Impressions(s) / UC Diagnoses   Final diagnoses:  None   Discharge Instructions   None    ED Prescriptions   None    PDMP not reviewed this encounter.   Charma Igo, FNP 04/17/22 1028    ImmordinoJeannett Senior, FNP 04/17/22 1032

## 2022-04-20 ENCOUNTER — Telehealth (HOSPITAL_COMMUNITY): Payer: Self-pay | Admitting: Emergency Medicine

## 2022-04-20 LAB — URINE CULTURE: Culture: >=100000 — AB

## 2022-04-20 MED ORDER — CEFUROXIME AXETIL 250 MG PO TABS: 250.0000 mg | ORAL_TABLET | Freq: Two times a day (BID) | ORAL | 0 refills | Status: AC

## 2022-04-20 MED ORDER — CEFUROXIME AXETIL 250 MG PO TABS: 250.0000 mg | ORAL_TABLET | Freq: Two times a day (BID) | ORAL | 0 refills | Status: DC

## 2022-04-20 NOTE — Telephone Encounter (Signed)
Patient is traveling and needed a different pharmacy

## 2022-04-29 ENCOUNTER — Encounter: Payer: BC Managed Care – PPO | Admitting: Family Medicine

## 2022-04-30 ENCOUNTER — Ambulatory Visit: Payer: BC Managed Care – PPO | Admitting: Family Medicine

## 2022-04-30 ENCOUNTER — Encounter: Payer: Self-pay | Admitting: Family Medicine

## 2022-04-30 VITALS — BP 120/74 | HR 73 | Temp 98.2°F | Ht 63.5 in | Wt 119.6 lb

## 2022-04-30 DIAGNOSIS — N9489 Other specified conditions associated with female genital organs and menstrual cycle: Secondary | ICD-10-CM

## 2022-04-30 DIAGNOSIS — Z1329 Encounter for screening for other suspected endocrine disorder: Secondary | ICD-10-CM | POA: Diagnosis not present

## 2022-04-30 DIAGNOSIS — Z13220 Encounter for screening for lipoid disorders: Secondary | ICD-10-CM

## 2022-04-30 DIAGNOSIS — E538 Deficiency of other specified B group vitamins: Secondary | ICD-10-CM

## 2022-04-30 DIAGNOSIS — D696 Thrombocytopenia, unspecified: Secondary | ICD-10-CM

## 2022-04-30 DIAGNOSIS — D72819 Decreased white blood cell count, unspecified: Secondary | ICD-10-CM

## 2022-04-30 DIAGNOSIS — E559 Vitamin D deficiency, unspecified: Secondary | ICD-10-CM

## 2022-04-30 DIAGNOSIS — R937 Abnormal findings on diagnostic imaging of other parts of musculoskeletal system: Secondary | ICD-10-CM

## 2022-04-30 DIAGNOSIS — R7309 Other abnormal glucose: Secondary | ICD-10-CM

## 2022-04-30 NOTE — Progress Notes (Signed)
SUBJECTIVE:   Chief Complaint  Patient presents with   Establish Care   HPI Patient presents to clinic to transfer care  No acute concerns today  Low back pain Chronic issue.  Previously seen by rheumatology and workup more consistent with L-spine mechanical pain versus inflammatory arthritis.  Was recommended to do low back exercises and continue meloxicam as needed for symptom relief.  Reviewed recent MRI back showing L5-S1 annular tear and a small left posterolateral disc herniation.  No nerve compression was demonstrated at that time.  At L2-L3, mild desiccation and bulging of the disc without stenosis or compression was noted.  Pelvic pain Reports history of pelvic congestion syndrome.  Had previously seen Dr. Gypsy Balsam in [place].  More recently Dr. Elesa Massed at [facility].  She continues to have mild left-sided pain and is wondering if this is contributing to her pain.  She is also noticed a decrease in sex drive.  PERTINENT PMH / PSH: Pelvic congestion syndrome Irritable bowel syndrome GERD Menopausal symptoms Low back pain Psoriasis  OBJECTIVE:  BP 120/74   Pulse 73   Temp 98.2 F (36.8 C)   Ht 5' 3.5" (1.613 m)   Wt 119 lb 9.6 oz (54.3 kg)   LMP  (LMP Unknown)   SpO2 99%   BMI 20.85 kg/m    Physical Exam Vitals reviewed.  Constitutional:      General: She is not in acute distress.    Appearance: She is not ill-appearing.  HENT:     Head: Normocephalic.     Nose: Nose normal.  Eyes:     Conjunctiva/sclera: Conjunctivae normal.  Cardiovascular:     Rate and Rhythm: Normal rate and regular rhythm.     Heart sounds: Normal heart sounds.  Pulmonary:     Effort: Pulmonary effort is normal.     Breath sounds: Normal breath sounds.  Abdominal:     General: Abdomen is flat. Bowel sounds are normal.     Palpations: Abdomen is soft.  Musculoskeletal:        General: Normal range of motion.     Cervical back: Normal range of motion.  Neurological:      Mental Status: She is alert and oriented to person, place, and time. Mental status is at baseline.  Psychiatric:        Mood and Affect: Mood normal.        Behavior: Behavior normal.        Thought Content: Thought content normal.        Judgment: Judgment normal.     ASSESSMENT/PLAN:  Vitamin D deficiency -     VITAMIN D 25 Hydroxy (Vit-D Deficiency, Fractures); Future  Female pelvic congestion syndrome Assessment & Plan: Chronic. Recommend she follow-up with OB/GYN Could consider physical therapy for pelvic floor exercises    Thrombocytopenia (HCC) Assessment & Plan: Chronic.  Asymptomatic. Recheck CBC.  Orders: -     CBC with Differential/Platelet; Future  Thyroid disorder screen -     TSH; Future  B12 deficiency -     Vitamin B12; Future  Encounter for screening for lipid disorder -     Lipid panel; Future  Abnormal glucose -     Comprehensive metabolic panel; Future -     Hemoglobin A1c; Future  Abnormal MRI, lumbar spine Assessment & Plan: Chronic low back pain with mild disc bulging and herniation. Recommend physical therapy, patient politely declined Recommend low back exercises and gentle stretching Remain mobile If symptoms worsen can  consider orthopedic referral.   Leukopenia, unspecified type Assessment & Plan: Chronic.  Asymptomatic Will recheck labs      PDMP reviewed  Return for annual visit with fasting labs 1 week prior.  Dana Allan, MD

## 2022-04-30 NOTE — Patient Instructions (Signed)
It was a pleasure meeting you today. Thank you for allowing me to take part in your health care.  Our goals for today as we discussed include:  Continue current medications  Schedule an appointment for you Medicare annual wellness  Referral sent for Mammogram. Please call to schedule appointment. [facility] [address] [place], [place] [address] [phone]   If you have any questions or concerns, please do not hesitate to call the office at [phone].  I look forward to our next visit and until then take care and stay safe.  Regards,   Chip Canepa, MD   [place]  

## 2022-05-16 ENCOUNTER — Encounter: Payer: Self-pay | Admitting: Family Medicine

## 2022-05-16 DIAGNOSIS — Z1211 Encounter for screening for malignant neoplasm of colon: Secondary | ICD-10-CM

## 2022-05-16 DIAGNOSIS — N9489 Other specified conditions associated with female genital organs and menstrual cycle: Secondary | ICD-10-CM

## 2022-05-16 NOTE — Assessment & Plan Note (Signed)
Chronic.  Asymptomatic Will recheck labs

## 2022-05-16 NOTE — Assessment & Plan Note (Signed)
Chronic.  Asymptomatic. Recheck CBC.

## 2022-05-16 NOTE — Assessment & Plan Note (Signed)
Chronic low back pain with mild disc bulging and herniation. Recommend physical therapy, patient politely declined Recommend low back exercises and gentle stretching Remain mobile If symptoms worsen can consider orthopedic referral.

## 2022-05-16 NOTE — Assessment & Plan Note (Signed)
Chronic. Recommend she follow-up with OB/GYN Could consider physical therapy for pelvic floor exercises

## 2022-05-19 NOTE — Telephone Encounter (Signed)
Patient would like to know what mg of calcium & vitamin D should she be taking?

## 2022-05-21 ENCOUNTER — Telehealth: Payer: Self-pay

## 2022-05-21 NOTE — Telephone Encounter (Signed)
Gastroenterology Pre-Procedure Review  Request Date: Made appointment with Dr.  Marius Ditch  Requesting Physician: Dr. Marius Ditch   PATIENT REVIEW QUESTIONS: The patient responded to the following health history questions as indicated:    1. Are you having any GI issues? Has had some constipation off and on.  Having gerd symptoms  2. Do you have a personal history of Polyps? no 3. Do you have a family history of Colon Cancer or Polyps? Farther diagnosed with colon cancer  4. Diabetes Mellitus? no 5. Joint replacements in the past 12 months?no 6. Major health problems in the past 3 months?no 7. Any artificial heart valves, MVP, or defibrillator?no    MEDICATIONS & ALLERGIES:    Patient reports the following regarding taking any anticoagulation/antiplatelet therapy:   Plavix, Coumadin, Eliquis, Xarelto, Lovenox, Pradaxa, Brilinta, or Effient? no Aspirin? no  Patient confirms/reports the following medications:  Current Outpatient Medications  Medication Sig Dispense Refill   calcium carbonate (OS-CAL) 600 MG tablet Take 600 mg by mouth daily.     Cholecalciferol (CVS D3) 50 MCG (2000 UT) CAPS Take by mouth.     clobetasol cream (TEMOVATE) 0.09 % Apply 1 application. topically 2 (two) times daily. Prn elbows 60 g 2   Clobetasol Prop Emollient Base 0.05 % emollient cream 1 application Externally Twice a day for 10 day(s)     COLLAGEN PO Take by mouth.     ELDERBERRY PO Take 50 mg by mouth daily.     GLUCOSAMINE HCL PO Take by mouth.     Multiple Vitamin (MULTI-VITAMINS PO)      pantoprazole (PROTONIX) 40 MG tablet TAKE ONE TABLET BY MOUTH DAILY 30 MINUTES BEFORE FOOD 90 tablet 1   No current facility-administered medications for this visit.    Patient confirms/reports the following allergies:  Allergies  Allergen Reactions   Other     Dairy and peanuts     No orders of the defined types were placed in this encounter.   AUTHORIZATION INFORMATION Primary Insurance: 1D#: Group  #:  Secondary Insurance: 1D#: Group #:  SCHEDULE INFORMATION: Date:  Time: Location:

## 2022-05-28 ENCOUNTER — Ambulatory Visit: Payer: BC Managed Care – PPO | Admitting: Gastroenterology

## 2022-06-04 ENCOUNTER — Encounter: Payer: Self-pay | Admitting: Gastroenterology

## 2022-06-04 ENCOUNTER — Ambulatory Visit: Payer: BC Managed Care – PPO | Admitting: Gastroenterology

## 2022-06-04 ENCOUNTER — Other Ambulatory Visit: Payer: Self-pay

## 2022-06-04 VITALS — BP 119/70 | HR 62 | Temp 98.0°F | Ht 63.5 in | Wt 120.4 lb

## 2022-06-04 DIAGNOSIS — R12 Heartburn: Secondary | ICD-10-CM

## 2022-06-04 DIAGNOSIS — Z1211 Encounter for screening for malignant neoplasm of colon: Secondary | ICD-10-CM

## 2022-06-04 MED ORDER — PANTOPRAZOLE SODIUM 20 MG PO TBEC: 20.0000 mg | DELAYED_RELEASE_TABLET | Freq: Every day | ORAL | 0 refills | Status: DC

## 2022-06-04 MED ORDER — NA SULFATE-K SULFATE-MG SULF 17.5-3.13-1.6 GM/177ML PO SOLN: 354.0000 mL | Freq: Once | ORAL | 0 refills | Status: AC

## 2022-06-04 NOTE — Progress Notes (Signed)
Cephas Darby, MD [address]  [place]  [place], [place] [address]  Main: [phone]  Fax: [phone]    Gastroenterology Consultation  Referring Provider:     Carollee Leitz, MD Primary Care Physician:  Carollee Leitz, MD Primary Gastroenterologist:  Dr. Cephas Darby Reason for Consultation: Heartburn        HPI:   Stacey [name] is a 52 y.o. female referred by Dr. Carollee Leitz, MD  for consultation & management of heartburn and has been dependent on Protonix 40 mg daily for more than 10 years.  Patient reports that her only symptom is heartburn for which she was started on Protonix 40 mg daily and she is not able to quit taking Protonix.  She tried lower dose of 20 mg for the relief.,  Resulted in return of heartburn.  She said she tried other over-the-counter proton pump inhibitors with no relief.  Currently, asymptomatic on current dose of Protonix.  She is s/p cystectomy in 2015.  She was experiencing diarrhea in the past, was taking cholestyramine since that she started taking collagen her diarrhea resolved and she stopped cholestyramine.  She reports that she was tested for celiac disease in the past which was negative.  Currently, her bowel movements have been regular.  Her father had colon cancer at age 67  NSAIDs: None  Antiplts/Anticoagulants/Anti thrombotics: None  GI Procedures:  EGD and colonoscopy 09/02/2006 Small hiatal hernia was identified, otherwise normal exam  Past Medical History:  Diagnosis Date   Annual physical exam 07/05/2019   Bleeding from the nose    nose cauterized 1980s   GERD (gastroesophageal reflux disease)    History of cholecystectomy 09/21/2018   IBS (irritable bowel syndrome)    Kidney cysts    Meniere disease    left hearing loss 2012    Pelvic congestion syndrome     Past Surgical History:  Procedure Laterality Date   ABDOMINAL HYSTERECTOMY     ovaries intact 2017 2/2 DUB   ABLATION     DUB 10/29/04   BREAST  BIOPSY Right 2015   benign   BREAST EXCISIONAL BIOPSY Right 2010   benign   BREAST SURGERY     bx in 2017/2018 neg dense breast; clip placement right breast, 2008 fibroadenoma breast bx   CHOLECYSTECTOMY     2015   DILATION AND CURETTAGE OF UTERUS     LAPAROSCOPY ABDOMEN DIAGNOSTIC     TONSILLECTOMY     1983   TUBAL LIGATION     WISDOM TOOTH EXTRACTION     1989     Current Outpatient Medications:    calcium carbonate (OS-CAL) 600 MG tablet, Take 600 mg by mouth daily., Disp: , Rfl:    Cholecalciferol (CVS D3) 50 MCG (2000 UT) CAPS, Take by mouth., Disp: , Rfl:    clobetasol cream (TEMOVATE) 2.83 %, Apply 1 application. topically 2 (two) times daily. Prn elbows, Disp: 60 g, Rfl: 2   Clobetasol Prop Emollient Base 0.05 % emollient cream, 1 application Externally Twice a day for 10 day(s), Disp: , Rfl:    COLLAGEN PO, Take by mouth., Disp: , Rfl:    ELDERBERRY PO, Take 50 mg by mouth daily., Disp: , Rfl:    GLUCOSAMINE HCL PO, Take by mouth., Disp: , Rfl:    Multiple Vitamin (MULTI-VITAMINS PO), , Disp: , Rfl:    Na Sulfate-K Sulfate-Mg Sulf 17.5-3.13-1.6 GM/177ML SOLN, Take 354 mLs by mouth once for 1 dose., Disp: 354 mL,  Rfl: 0   pantoprazole (PROTONIX) 20 MG tablet, Take 1 tablet (20 mg total) by mouth daily., Disp: 30 tablet, Rfl: 0   Family History  Problem Relation Age of Onset   Arthritis Mother    Hypertension Mother    Arthritis Father    Hypertension Father    Diabetes Father    Hearing loss Father    Hyperlipidemia Father    Cancer Paternal Aunt        gu cancer ? type-endometrial   Rheumatic fever Paternal Aunt    Mental illness Paternal Aunt        cognitive impairment   Arthritis Maternal Grandmother    Diabetes Maternal Grandmother    Hyperlipidemia Maternal Grandmother    Hypertension Maternal Grandmother    Arthritis Maternal Grandfather    Hyperlipidemia Maternal Grandfather    Hypertension Maternal Grandfather    Arthritis Paternal Grandmother     Diabetes Paternal Grandmother    Hyperlipidemia Paternal Grandmother    Hypertension Paternal Grandmother    Arthritis Paternal Grandfather    Hyperlipidemia Paternal Grandfather    Hypertension Paternal Grandfather    Arthritis Sister    Miscarriages / Stillbirths Sister    Deep vein thrombosis Sister    Scoliosis Sister    Breast cancer Neg Hx      Social History   Tobacco Use   Smoking status: Never   Smokeless tobacco: Never  Substance Use Topics   Alcohol use: Yes   Drug use: Not Currently    Allergies as of 06/04/2022 - Review Complete 06/04/2022  Allergen Reaction Noted   Other  04/19/2018    Review of Systems:    All systems reviewed and negative except where noted in HPI.   Physical Exam:  BP 119/70 (BP Location: Left Arm, Patient Position: Sitting, Cuff Size: Normal)   Pulse 62   Temp 98 F (36.7 C) (Oral)   Ht 5' 3.5" (1.613 m)   Wt 120 lb 6 oz (54.6 kg)   LMP  (LMP Unknown)   BMI 20.99 kg/m  No LMP recorded (lmp unknown). Patient has had a hysterectomy.  General:   Alert,  Well-developed, well-nourished, pleasant and cooperative in NAD Head:  Normocephalic and atraumatic. Eyes:  Sclera clear, no icterus.   Conjunctiva pink. Ears:  Normal auditory acuity. Nose:  No deformity, discharge, or lesions. Mouth:  No deformity or lesions,oropharynx pink & moist. Neck:  Supple; no masses or thyromegaly. Lungs:  Respirations even and unlabored.  Clear throughout to auscultation.   No wheezes, crackles, or rhonchi. No acute distress. Heart:  Regular rate and rhythm; no murmurs, clicks, rubs, or gallops. Abdomen:  Normal bowel sounds. Soft, non-tender and non-distended without masses, hepatosplenomegaly or hernias noted.  No guarding or rebound tenderness.   Rectal: Not performed Msk:  Symmetrical without gross deformities. Good, equal movement & strength bilaterally. Pulses:  Normal pulses noted. Extremities:  No clubbing or edema.  No cyanosis. Neurologic:   Alert and oriented x3;  grossly normal neurologically. Skin:  Intact without significant lesions or rashes. No jaundice. Psych:  Alert and cooperative. Normal mood and affect.  Imaging Studies: No abdominal imaging  Assessment and Plan:   Stacey [name] is a 52 y.o. female with history of chronic heartburn, PPI dependent  Chronic heartburn: With no other alarm symptoms EGD negative in the past Currently on Protonix 40 mg daily.  Patient is unable to stop taking Protonix.  Advised her regarding slow weaning because she has been on Protonix  for at least 10 years.  She will try 20 mg daily for 4 weeks, discussed about rebound reflux symptoms for which she could take over-the-counter Pepcid as needed.  After 4 weeks of low-dose Protonix, she could try stop taking it  Colon cancer screening Discussed about colonoscopy and patient is agreeable  I have discussed alternative options, risks & benefits,  which include, but are not limited to, bleeding, infection, perforation,respiratory complication & drug reaction.  The patient agrees with this plan & written consent will be obtained.   Mild chronic thrombocytopenia, most likely benign and nothing to intervene Patient can be referred to hematology for further evaluation, will defer to PCP   Follow up as needed, she will contact via MyChart as needed   Arlyss Repress, MD

## 2022-06-16 ENCOUNTER — Other Ambulatory Visit: Payer: Self-pay

## 2022-06-16 DIAGNOSIS — N9489 Other specified conditions associated with female genital organs and menstrual cycle: Secondary | ICD-10-CM

## 2022-06-18 ENCOUNTER — Ambulatory Visit: Payer: BC Managed Care – PPO | Admitting: Anesthesiology

## 2022-06-18 ENCOUNTER — Encounter
Admission: RE | Disposition: A | Discharge status: Home / Self Care | Payer: Self-pay | Source: Ambulatory Visit | Attending: Gastroenterology

## 2022-06-18 ENCOUNTER — Encounter: Payer: Self-pay | Admitting: Gastroenterology

## 2022-06-18 ENCOUNTER — Ambulatory Visit
Admission: RE | Admit: 2022-06-18 | Discharge: 2022-06-18 | Disposition: A | Discharge status: Home / Self Care | Payer: BC Managed Care – PPO | Source: Ambulatory Visit | Attending: Gastroenterology | Admitting: Gastroenterology

## 2022-06-18 DIAGNOSIS — K589 Irritable bowel syndrome without diarrhea: Secondary | ICD-10-CM | POA: Diagnosis not present

## 2022-06-18 DIAGNOSIS — K219 Gastro-esophageal reflux disease without esophagitis: Secondary | ICD-10-CM | POA: Diagnosis not present

## 2022-06-18 DIAGNOSIS — Z1211 Encounter for screening for malignant neoplasm of colon: Secondary | ICD-10-CM | POA: Diagnosis not present

## 2022-06-18 DIAGNOSIS — K644 Residual hemorrhoidal skin tags: Secondary | ICD-10-CM | POA: Insufficient documentation

## 2022-06-18 HISTORY — PX: COLONOSCOPY WITH PROPOFOL: SHX5780

## 2022-06-18 SURGERY — COLONOSCOPY WITH PROPOFOL
Anesthesia: General

## 2022-06-18 MED ORDER — SODIUM CHLORIDE 0.9 % IV SOLN: INTRAVENOUS | Status: DC

## 2022-06-18 MED ORDER — LIDOCAINE HCL (CARDIAC) PF 100 MG/5ML IV SOSY
PREFILLED_SYRINGE | INTRAVENOUS | Status: DC | PRN
  Administered 2022-06-18: 100 mg via INTRAVENOUS

## 2022-06-18 MED ORDER — PROPOFOL 500 MG/50ML IV EMUL
INTRAVENOUS | Status: DC | PRN
  Administered 2022-06-18: 165 ug/kg/min via INTRAVENOUS

## 2022-06-18 MED ORDER — PROPOFOL 10 MG/ML IV BOLUS
INTRAVENOUS | Status: DC | PRN
  Administered 2022-06-18: 60 mg via INTRAVENOUS
  Administered 2022-06-18: 40 mg via INTRAVENOUS

## 2022-06-18 NOTE — H&P (Signed)
Cephas Darby, MD [address]  [place]  [place], [place] [address]  Main: [phone]  Fax: [phone] Pager: [phone]  Primary Care Physician:  Carollee Leitz, MD Primary Gastroenterologist:  Dr. Cephas Darby  Pre-Procedure History & Physical: HPI:  Stacey [name] is a 52 y.o. female is here for an colonoscopy.   Past Medical History:  Diagnosis Date   Annual physical exam 07/05/2019   Bleeding from the nose    nose cauterized 1980s   GERD (gastroesophageal reflux disease)    History of cholecystectomy 09/21/2018   IBS (irritable bowel syndrome)    Kidney cysts    Meniere disease    left hearing loss 2012    Pelvic congestion syndrome     Past Surgical History:  Procedure Laterality Date   ABDOMINAL HYSTERECTOMY     ovaries intact 2017 2/2 DUB   ABLATION     DUB 10/29/04   BREAST BIOPSY Right 2015   benign   BREAST EXCISIONAL BIOPSY Right 2010   benign   BREAST SURGERY     bx in 2017/2018 neg dense breast; clip placement right breast, 2008 fibroadenoma breast bx   CHOLECYSTECTOMY     2015   DILATION AND CURETTAGE OF UTERUS     LAPAROSCOPY ABDOMEN DIAGNOSTIC     [place]    Prior to Admission medications   Medication Sig Start Date End Date Taking? Authorizing Provider  calcium carbonate (OS-CAL) 600 MG tablet Take 600 mg by mouth daily.    [provider]  Cholecalciferol (CVS D3) 50 MCG (2000 UT) CAPS Take by mouth.    [provider]  clobetasol cream (TEMOVATE) 0.94 % Apply 1 application. topically 2 (two) times daily. Prn elbows 08/18/21   McLean-Scocuzza, Nino Glow, MD  Clobetasol Prop Emollient Base 0.05 % emollient cream 1 application Externally Twice a day for 10 day(s)    [provider]  COLLAGEN PO Take by mouth.    [provider]  ELDERBERRY PO Take 50 mg by mouth daily.    [provider]  GLUCOSAMINE HCL PO Take by  mouth.    [provider]  Multiple Vitamin (MULTI-VITAMINS PO)  05/11/21   [provider]  pantoprazole (PROTONIX) 20 MG tablet Take 1 tablet (20 mg total) by mouth daily. 06/04/22   Lin Landsman, MD    Allergies as of 06/04/2022 - Review Complete 06/04/2022  Allergen Reaction Noted   Other  04/19/2018    Family History  Problem Relation Age of Onset   Arthritis Mother    Hypertension Mother    Arthritis Father    Hypertension Father    Diabetes Father    Hearing loss Father    Hyperlipidemia Father    Cancer Paternal Aunt        gu cancer ? type-endometrial   Rheumatic fever Paternal Aunt    Mental illness Paternal Aunt        cognitive impairment   Arthritis Maternal Grandmother    Diabetes Maternal Grandmother    Hyperlipidemia Maternal Grandmother    Hypertension Maternal Grandmother    Arthritis Maternal Grandfather    Hyperlipidemia Maternal Grandfather    Hypertension Maternal Grandfather    Arthritis Paternal Grandmother    Diabetes Paternal Grandmother    Hyperlipidemia Paternal Grandmother    Hypertension Paternal Grandmother    Arthritis Paternal Merchant navy officer  Hyperlipidemia Paternal Grandfather    Hypertension Paternal Grandfather    Arthritis Sister    Miscarriages / [place] Sister    Deep vein thrombosis Sister    Scoliosis Sister    Breast cancer Neg Hx     Social History   Socioeconomic History   Marital status: Married    Spouse name: Not on file   Number of children: Not on file   Years of education: Not on file   Highest education level: Not on file  Occupational History   Not on file  Tobacco Use   Smoking status: Never   Smokeless tobacco: Never  Substance and Sexual Activity   Alcohol use: Yes   Drug use: Not Currently   Sexual activity: Yes    Partners: Male  Other Topics Concern   Not on file  Social History Narrative   Moved from [place] 2019    2 kids girl and boy born 1995 and [place]  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       No guns    Wears seat belt    Safe in relationship    Never smoker    Social Determinants of Radio broadcast assistant Strain: Not on file  Food Insecurity: Not on file  Transportation Needs: Not on file  Physical Activity: Not on file  Stress: Not on file  Social Connections: Not on file  Intimate Partner Violence: Not on file    Review of Systems: See HPI, otherwise negative ROS  Physical Exam: BP 102/61   Temp (!) 97.4 F (36.3 C) (Temporal)   Resp 16   Ht 5\' 5"  (1.651 m)   Wt 52.4 kg   LMP  (LMP Unknown)   SpO2 100%   BMI 19.24 kg/m  General:   Alert,  pleasant and cooperative in NAD Head:  Normocephalic and atraumatic. Neck:  Supple; no masses or thyromegaly. Lungs:  Clear throughout to auscultation.    Heart:  Regular rate and rhythm. Abdomen:  Soft, nontender and nondistended. Normal bowel sounds, without guarding, and without rebound.   Neurologic:  Alert and  oriented x4;  grossly normal neurologically.  Impression/Plan: Stacey [name] is here for an colonoscopy to be performed for colon cancer screening  Risks, benefits, limitations, and alternatives regarding  colonoscopy have been reviewed with the patient.  Questions have been answered.  All parties agreeable.   Sherri Sear, MD  06/18/2022, 12:30 PM

## 2022-06-18 NOTE — Transfer of Care (Signed)
Immediate Anesthesia Transfer of Care Note  Patient: Stacey [name]  Procedure(s) Performed: COLONOSCOPY WITH PROPOFOL  Patient Location: PACU  Anesthesia Type:General  Level of Consciousness: drowsy and patient cooperative  Airway & Oxygen Therapy: Patient Spontanous Breathing and Patient connected to face mask oxygen  Post-op Assessment: Report given to RN, Post -op Vital signs reviewed and stable, and Patient moving all extremities  Post vital signs: Reviewed and stable  Last Vitals:  Vitals Value Taken Time  BP    Temp    Pulse    Resp    SpO2      Last Pain:  Vitals:   06/18/22 1228  TempSrc: Temporal  PainSc: 0-No pain         Complications: No notable events documented.

## 2022-06-18 NOTE — Anesthesia Preprocedure Evaluation (Signed)
Anesthesia Evaluation  Patient identified by MRN, date of birth, ID band Patient awake    Reviewed: Allergy & Precautions, NPO status , Patient's Chart, lab work & pertinent test results  Airway Mallampati: II  TM Distance: >3 FB Neck ROM: full    Dental  (+) Teeth Intact   Pulmonary neg pulmonary ROS   Pulmonary exam normal        Cardiovascular negative cardio ROS Normal cardiovascular exam     Neuro/Psych negative neurological ROS  negative psych ROS   GI/Hepatic negative GI ROS, Neg liver ROS,GERD  Controlled,,  Endo/Other  negative endocrine ROS    Renal/GU Renal diseasenegative Renal ROS  negative genitourinary   Musculoskeletal   Abdominal   Peds  Hematology negative hematology ROS (+)   Anesthesia Other Findings Past Medical History: 07/05/2019: Annual physical exam No date: Bleeding from the nose     Comment:  nose cauterized 1980s No date: GERD (gastroesophageal reflux disease) 09/21/2018: History of cholecystectomy No date: IBS (irritable bowel syndrome) No date: Kidney cysts No date: Meniere disease     Comment:  left hearing loss 2012  No date: Pelvic congestion syndrome  Past Surgical History: No date: ABDOMINAL HYSTERECTOMY     Comment:  ovaries intact 2017 2/2 DUB No date: ABLATION     Comment:  DUB 10/29/04 2015: BREAST BIOPSY; Right     Comment:  benign 2010: BREAST EXCISIONAL BIOPSY; Right     Comment:  benign No date: BREAST SURGERY     Comment:  bx in 2017/2018 neg dense breast; clip placement right               breast, 2008 fibroadenoma breast bx No date: CHOLECYSTECTOMY     Comment:  2015 No date: DILATION AND CURETTAGE OF UTERUS No date: LAPAROSCOPY ABDOMEN DIAGNOSTIC No date: TONSILLECTOMY     Comment:  1983 No date: TUBAL LIGATION No date: WISDOM TOOTH EXTRACTION     Comment:  1989  BMI    Body Mass Index: 19.24 kg/m      Reproductive/Obstetrics negative OB  ROS                             Anesthesia Physical Anesthesia Plan  ASA: 2  Anesthesia Plan: General   Post-op Pain Management:    Induction: Intravenous  PONV Risk Score and Plan: Propofol infusion and TIVA  Airway Management Planned: Natural Airway and Nasal Cannula  Additional Equipment:   Intra-op Plan:   Post-operative Plan:   Informed Consent: I have reviewed the patients History and Physical, chart, labs and discussed the procedure including the risks, benefits and alternatives for the proposed anesthesia with the patient or authorized representative who has indicated his/her understanding and acceptance.     Dental Advisory Given  Plan Discussed with: Anesthesiologist, CRNA and Surgeon  Anesthesia Plan Comments: (Patient consented for risks of anesthesia including but not limited to:  - adverse reactions to medications - risk of airway placement if required - damage to eyes, teeth, lips or other oral mucosa - nerve damage due to positioning  - sore throat or hoarseness - Damage to heart, brain, nerves, lungs, other parts of body or loss of life  Patient voiced understanding.)       Anesthesia Quick Evaluation

## 2022-06-18 NOTE — Op Note (Signed)
[facility] Gastroenterology Patient Name: Stacey [name] Procedure Date: 06/18/2022 1:43 PM MRN: [id number] Account #: [id number] Date of Birth: [birth year] Admit Type: Outpatient Age: 52 Room: [facility] ENDO ROOM 3 Gender: Female Note Status: Finalized Instrument Name: Peds Colonoscope [id number] Procedure:             Colonoscopy Indications:           Screening for colorectal malignant neoplasm, This is                         the patient's first colonoscopy Providers:             Lin Landsman MD, MD Referring MD:          Carollee Leitz (Referring MD) Medicines:             General Anesthesia Complications:         No immediate complications. Estimated blood loss: None. Procedure:             Pre-Anesthesia Assessment:                        - Prior to the procedure, a History and Physical was                         performed, and patient medications and allergies were                         reviewed. The patient is competent. The risks and                         benefits of the procedure and the sedation options and                         risks were discussed with the patient. All questions                         were answered and informed consent was obtained.                         Patient identification and proposed procedure were                         verified by the physician, the nurse, the                         anesthesiologist, the anesthetist and the technician                         in the pre-procedure area in the procedure room in the                         endoscopy suite. Mental Status Examination: alert and                         oriented. Airway Examination: normal oropharyngeal                         airway and neck mobility. Respiratory Examination:  clear to auscultation. CV Examination: normal.                         Prophylactic Antibiotics: The patient does not require                          prophylactic antibiotics. Prior Anticoagulants: The                         patient has taken no anticoagulant or antiplatelet                         agents. ASA Grade Assessment: II - A patient with mild                         systemic disease. After reviewing the risks and                         benefits, the patient was deemed in satisfactory                         condition to undergo the procedure. The anesthesia                         plan was to use general anesthesia. Immediately prior                         to administration of medications, the patient was                         re-assessed for adequacy to receive sedatives. The                         heart rate, respiratory rate, oxygen saturations,                         blood pressure, adequacy of pulmonary ventilation, and                         response to care were monitored throughout the                         procedure. The physical status of the patient was                         re-assessed after the procedure.                        After obtaining informed consent, the colonoscope was                         passed under direct vision. Throughout the procedure,                         the patient's blood pressure, pulse, and oxygen                         saturations were monitored continuously. The  Colonoscope was introduced through the anus and                         advanced to the the cecum, identified by appendiceal                         orifice and ileocecal valve. The colonoscopy was                         performed without difficulty. The patient tolerated                         the procedure well. The quality of the bowel                         preparation was evaluated using the BBPS [facility] Bowel                         Preparation Scale) with scores of: Right Colon = 3,                         Transverse Colon = 3 and Left Colon = 3 (entire mucosa                          seen well with no residual staining, small fragments                         of stool or opaque liquid). The total BBPS score                         equals 9. The ileocecal valve, appendiceal orifice,                         and rectum were photographed. Findings:      The perianal and digital rectal examinations were normal. Pertinent       negatives include normal sphincter tone and no palpable rectal lesions.      Non-bleeding external hemorrhoids were found during retroflexion. The       hemorrhoids were medium-sized.      The entire examined colon appeared normal. Impression:            - Non-bleeding external hemorrhoids.                        - The entire examined colon is normal.                        - No specimens collected. Recommendation:        - Discharge patient to home (with escort).                        - Resume previous diet today.                        - Continue present medications.                        - Repeat colonoscopy in 10 years for screening  purposes. Procedure Code(s):     --- Professional ---                        Z3086, Colorectal cancer screening; colonoscopy on                         individual not meeting criteria for high risk Diagnosis Code(s):     --- Professional ---                        Z12.11, Encounter for screening for malignant neoplasm                         of colon                        K64.4, Residual hemorrhoidal skin tags CPT copyright 2022 American Medical Association. All rights reserved. The codes documented in this report are preliminary and upon coder review may  be revised to meet current compliance requirements. Dr. Ulyess Mort Lin Landsman MD, MD 06/18/2022 2:08:55 PM This report has been signed electronically. Number of Addenda: 0 Note Initiated On: 06/18/2022 1:43 PM Scope Withdrawal Time: 0 hours 6 minutes 49 seconds  Total Procedure Duration: 0 hours 10 minutes 34 seconds   Estimated Blood Loss:  Estimated blood loss: none.      [facility]

## 2022-06-19 ENCOUNTER — Encounter: Payer: Self-pay | Admitting: Gastroenterology

## 2022-07-03 ENCOUNTER — Other Ambulatory Visit: Payer: Self-pay | Admitting: Gastroenterology

## 2022-07-03 NOTE — Anesthesia Postprocedure Evaluation (Signed)
Anesthesia Post Note  Patient: Maitreyi Talluto  Procedure(s) Performed: COLONOSCOPY WITH PROPOFOL  Patient location during evaluation: Endoscopy Anesthesia Type: General Level of consciousness: awake and alert Pain management: pain level controlled Vital Signs Assessment: post-procedure vital signs reviewed and stable Respiratory status: spontaneous breathing, nonlabored ventilation and respiratory function stable Cardiovascular status: blood pressure returned to baseline and stable Postop Assessment: no apparent nausea or vomiting Anesthetic complications: no   No notable events documented.   Last Vitals:  Vitals:   06/18/22 1421 06/18/22 1431  BP: 100/74 107/65  Pulse: 64 63  Resp: 16 14  Temp:    SpO2: 99% 99%    Last Pain:  Vitals:   06/19/22 0723  TempSrc:   PainSc: 0-No pain                 Alphonsus Sias

## 2022-07-08 DIAGNOSIS — R1032 Left lower quadrant pain: Secondary | ICD-10-CM | POA: Diagnosis not present

## 2022-07-08 DIAGNOSIS — Z1389 Encounter for screening for other disorder: Secondary | ICD-10-CM | POA: Diagnosis not present

## 2022-07-08 DIAGNOSIS — Z01419 Encounter for gynecological examination (general) (routine) without abnormal findings: Secondary | ICD-10-CM | POA: Diagnosis not present

## 2022-07-08 HISTORY — DX: Left lower quadrant pain: R10.32

## 2022-07-09 ENCOUNTER — Ambulatory Visit: Payer: BC Managed Care – PPO | Admitting: Internal Medicine

## 2022-07-16 ENCOUNTER — Other Ambulatory Visit (INDEPENDENT_AMBULATORY_CARE_PROVIDER_SITE_OTHER): Payer: BC Managed Care – PPO

## 2022-07-16 DIAGNOSIS — R7309 Other abnormal glucose: Secondary | ICD-10-CM

## 2022-07-16 DIAGNOSIS — Z1329 Encounter for screening for other suspected endocrine disorder: Secondary | ICD-10-CM

## 2022-07-16 DIAGNOSIS — D696 Thrombocytopenia, unspecified: Secondary | ICD-10-CM | POA: Diagnosis not present

## 2022-07-16 DIAGNOSIS — E559 Vitamin D deficiency, unspecified: Secondary | ICD-10-CM | POA: Diagnosis not present

## 2022-07-16 DIAGNOSIS — E538 Deficiency of other specified B group vitamins: Secondary | ICD-10-CM

## 2022-07-16 DIAGNOSIS — Z13220 Encounter for screening for lipoid disorders: Secondary | ICD-10-CM | POA: Diagnosis not present

## 2022-07-16 LAB — LIPID PANEL
Cholesterol: 177 mg/dL (ref 0–200)
HDL: 68 mg/dL (ref >39.00)
LDL Cholesterol: 99 mg/dL (ref 0–99)
NonHDL: 109
Total CHOL/HDL Ratio: 3
Triglycerides: 48 mg/dL (ref 0.0–149.0)
VLDL: 9.6 mg/dL (ref 0.0–40.0)

## 2022-07-16 LAB — CBC WITH DIFFERENTIAL/PLATELET
Basophils Absolute: 0 10*3/uL (ref 0.0–0.1)
Basophils Relative: 1.2 % (ref 0.0–3.0)
Eosinophils Absolute: 0 10*3/uL (ref 0.0–0.7)
Eosinophils Relative: 0.9 % (ref 0.0–5.0)
HCT: 40.3 % (ref 36.0–46.0)
Hemoglobin: 13.8 g/dL (ref 12.0–15.0)
Lymphocytes Relative: 35.4 % (ref 12.0–46.0)
Lymphs Abs: 1.3 10*3/uL (ref 0.7–4.0)
MCHC: 34.2 g/dL (ref 30.0–36.0)
MCV: 93.3 fl (ref 78.0–100.0)
Monocytes Absolute: 0.3 10*3/uL (ref 0.1–1.0)
Monocytes Relative: 8.1 % (ref 3.0–12.0)
Neutro Abs: 2 10*3/uL (ref 1.4–7.7)
Neutrophils Relative %: 54.4 % (ref 43.0–77.0)
Platelets: 167 10*3/uL (ref 150.0–400.0)
RBC: 4.32 Mil/uL (ref 3.87–5.11)
RDW: 12.3 % (ref 11.5–15.5)
WBC: 3.7 10*3/uL — ABNORMAL LOW (ref 4.0–10.5)

## 2022-07-16 LAB — COMPREHENSIVE METABOLIC PANEL
ALT: 14 U/L (ref 0–35)
AST: 19 U/L (ref 0–37)
Albumin: 4.1 g/dL (ref 3.5–5.2)
Alkaline Phosphatase: 29 U/L — ABNORMAL LOW (ref 39–117)
BUN: 15 mg/dL (ref 6–23)
CO2: 30 mEq/L (ref 19–32)
Calcium: 9.6 mg/dL (ref 8.4–10.5)
Chloride: 103 mEq/L (ref 96–112)
Creatinine, Ser: 0.9 mg/dL (ref 0.40–1.20)
GFR: 73.25 mL/min (ref >60.00)
Glucose, Bld: 86 mg/dL (ref 70–99)
Potassium: 3.9 mEq/L (ref 3.5–5.1)
Sodium: 142 mEq/L (ref 135–145)
Total Bilirubin: 0.6 mg/dL (ref 0.2–1.2)
Total Protein: 6.6 g/dL (ref 6.0–8.3)

## 2022-07-16 LAB — VITAMIN D 25 HYDROXY (VIT D DEFICIENCY, FRACTURES): VITD: 43.5 ng/mL (ref 30.00–100.00)

## 2022-07-16 LAB — HEMOGLOBIN A1C: Hgb A1c MFr Bld: 5.2 % (ref 4.6–6.5)

## 2022-07-16 LAB — VITAMIN B12: Vitamin B-12: 1101 pg/mL — ABNORMAL HIGH (ref 211–911)

## 2022-07-16 LAB — TSH: TSH: 2.37 u[IU]/mL (ref 0.35–5.50)

## 2022-07-20 ENCOUNTER — Encounter: Payer: Self-pay | Admitting: Family Medicine

## 2022-07-20 ENCOUNTER — Ambulatory Visit (INDEPENDENT_AMBULATORY_CARE_PROVIDER_SITE_OTHER): Payer: BC Managed Care – PPO | Admitting: Family Medicine

## 2022-07-20 ENCOUNTER — Other Ambulatory Visit: Payer: Self-pay | Admitting: Family Medicine

## 2022-07-20 VITALS — BP 90/60 | HR 62 | Temp 98.3°F | Ht 65.0 in | Wt 119.2 lb

## 2022-07-20 DIAGNOSIS — D696 Thrombocytopenia, unspecified: Secondary | ICD-10-CM

## 2022-07-20 DIAGNOSIS — Z23 Encounter for immunization: Secondary | ICD-10-CM | POA: Diagnosis not present

## 2022-07-20 DIAGNOSIS — D72819 Decreased white blood cell count, unspecified: Secondary | ICD-10-CM

## 2022-07-20 DIAGNOSIS — Z0000 Encounter for general adult medical examination without abnormal findings: Secondary | ICD-10-CM

## 2022-07-20 DIAGNOSIS — K219 Gastro-esophageal reflux disease without esophagitis: Secondary | ICD-10-CM | POA: Diagnosis not present

## 2022-07-20 DIAGNOSIS — N9489 Other specified conditions associated with female genital organs and menstrual cycle: Secondary | ICD-10-CM

## 2022-07-20 MED ORDER — FAMOTIDINE 40 MG PO TABS: 40.0000 mg | ORAL_TABLET | Freq: Every day | ORAL | 1 refills | Status: DC

## 2022-07-20 MED ORDER — PANTOPRAZOLE SODIUM 20 MG PO TBEC: 20.0000 mg | DELAYED_RELEASE_TABLET | Freq: Every day | ORAL | 0 refills | Status: DC

## 2022-07-20 NOTE — Patient Instructions (Addendum)
It was a pleasure meeting you today. Thank you for allowing me to take part in your health care.  Our goals for today as we discussed include:  Stop Multivitamin for 1-2 weeks.  Can resume in 2 weeks and take every other day.  Start Pepcid 40 mg daily. Continue Protonix 20 mg daily. In 1 week decrease Protonix to every other day as discussed.  Follow-up with in 4 weeks.  Received Tdap today.  Recommend Shingles vaccine.  This is a 2 dose series and can be given at your local pharmacy.  Please talk to your pharmacist about this.   If you have any questions or concerns, please do not hesitate to call the office at [phone].  I look forward to our next visit and until then take care and stay safe.  Regards,   Carollee Leitz, MD   [facility]

## 2022-07-20 NOTE — Progress Notes (Unsigned)
   SUBJECTIVE:   Chief Complaint  Patient presents with   Annual Exam   HPI Patient presents to clinic for annual physical.  GERD Protonix decreased to 20 mg by GI.  Unable to get relief by week 3.  Would like to increase back to 40 mg.  PERTINENT PMH / PSH: ***  OBJECTIVE:  BP 90/60   Pulse 62   Temp 98.3 F (36.8 C) (Oral)   Ht 5\' 5"  (1.651 m)   Wt 119 lb 3.2 oz (54.1 kg)   LMP  (LMP Unknown)   SpO2 98%   BMI 19.84 kg/m    Physical Exam Vitals reviewed.  Constitutional:      General: She is not in acute distress.    Appearance: She is not ill-appearing.  HENT:     Head: Normocephalic.     Nose: Nose normal.  Eyes:     Conjunctiva/sclera: Conjunctivae normal.  Neck:     Thyroid: No thyromegaly or thyroid tenderness.  Cardiovascular:     Rate and Rhythm: Normal rate and regular rhythm.     Heart sounds: Normal heart sounds.  Pulmonary:     Effort: Pulmonary effort is normal.     Breath sounds: Normal breath sounds.  Abdominal:     General: Abdomen is flat. Bowel sounds are normal.     Palpations: Abdomen is soft.  Musculoskeletal:        General: Normal range of motion.     Cervical back: Normal range of motion.  Neurological:     Mental Status: She is alert and oriented to person, place, and time. Mental status is at baseline.  Psychiatric:        Mood and Affect: Mood normal.        Behavior: Behavior normal.        Thought Content: Thought content normal.        Judgment: Judgment normal.     ASSESSMENT/PLAN:  There are no diagnoses linked to this encounter. PDMP reviewed***  No follow-ups on file.  Carollee Leitz, MD

## 2022-07-23 ENCOUNTER — Encounter: Payer: Self-pay | Admitting: Family Medicine

## 2022-07-23 DIAGNOSIS — Z23 Encounter for immunization: Secondary | ICD-10-CM | POA: Insufficient documentation

## 2022-07-23 NOTE — Assessment & Plan Note (Signed)
Chronic.  Asymptomatic.  WBC remains low.  Has had previous workup with oncology. Continue to monitor and if trending downward will refer back to oncology.

## 2022-07-23 NOTE — Assessment & Plan Note (Signed)
Chronic. Recently started estradiol 0.05 mg twice daily. Continue to follow-up with OB/GYN

## 2022-07-23 NOTE — Assessment & Plan Note (Addendum)
Chronic.  Resolved.

## 2022-07-23 NOTE — Assessment & Plan Note (Signed)
Chronic.  Not controlled with PPI 20 mg daily. Adding famotidine 20 mg twice daily Continue Protonix 20 mg daily in hopes to be able to wean off medication.  Instructions provided for weaning to decrease rebound effect. If not improved will refer to GI for EGD.

## 2022-07-23 NOTE — Assessment & Plan Note (Addendum)
HCM Declined hepatitis C screening Recommend shingles vaccine Declined influenza vaccine Up-to-date with COVID.  Declined booster. Mammogram up-to-date.  Due 8/25. Colonoscopy up-to-date.  Due 06/2032 Tdap administered today. Pap not indicated.  History of TAH 2017. Lipid screening completed. Hypertension screening completed

## 2022-08-04 ENCOUNTER — Encounter: Payer: Self-pay | Admitting: Family Medicine

## 2022-08-19 ENCOUNTER — Ambulatory Visit: Payer: BC Managed Care – PPO | Admitting: Family Medicine

## 2022-09-15 ENCOUNTER — Other Ambulatory Visit: Payer: Self-pay | Admitting: Family Medicine

## 2022-09-15 DIAGNOSIS — K219 Gastro-esophageal reflux disease without esophagitis: Secondary | ICD-10-CM

## 2022-09-25 DIAGNOSIS — S93401A Sprain of unspecified ligament of right ankle, initial encounter: Secondary | ICD-10-CM | POA: Diagnosis not present

## 2022-09-28 IMAGING — DX DG FOOT COMPLETE 3+V*L*
3 series · 3 of 3 positions shown · non-contrast
Comparison: None.

CLINICAL DATA: Left foot pain 2 months.  No injury.

EXAM:
LEFT FOOT - COMPLETE 3+ VIEW

[foot ap]
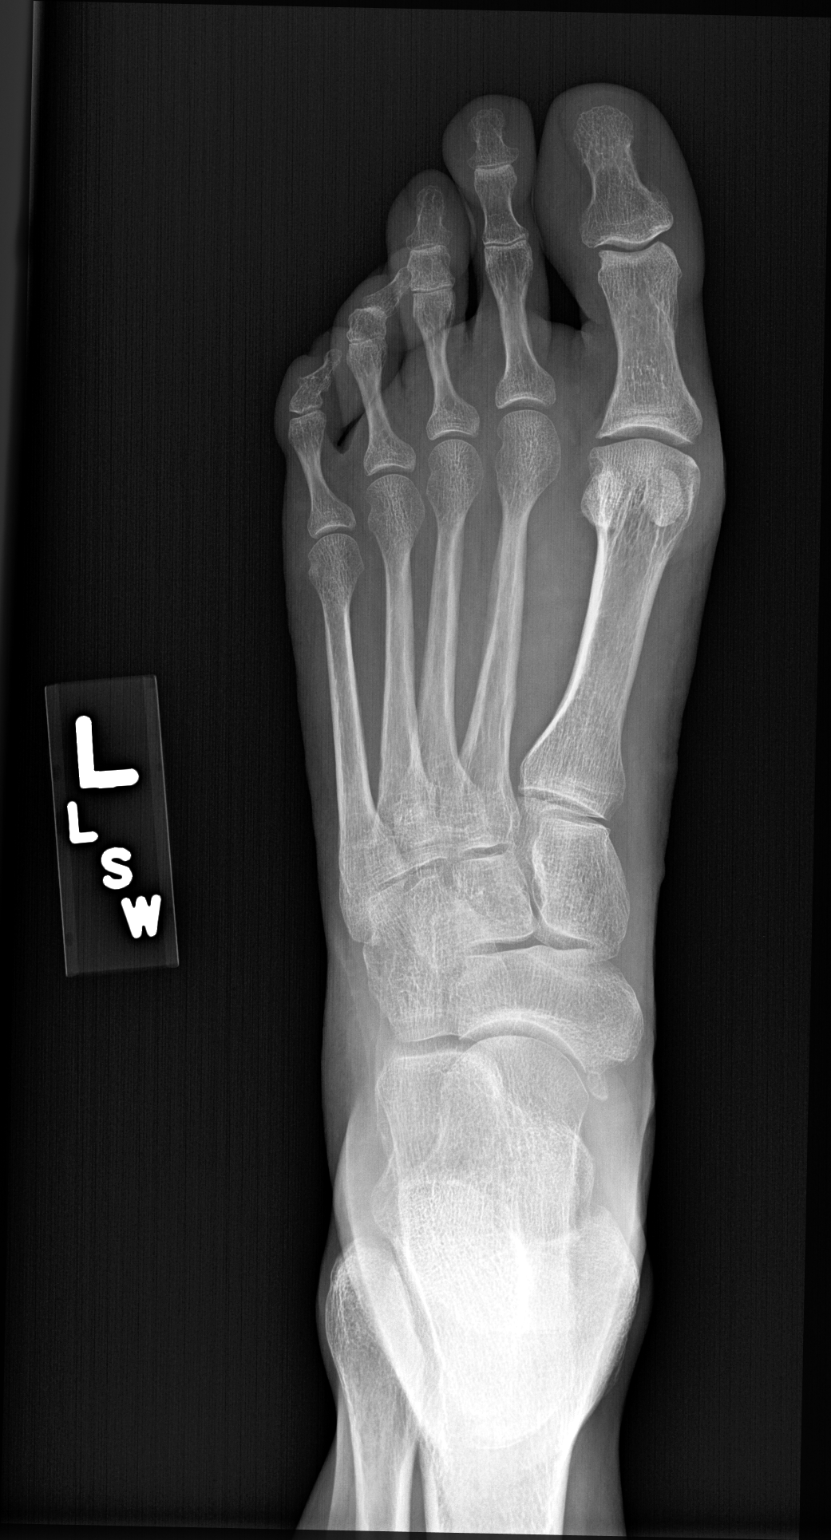

[foot obl (oblique)]
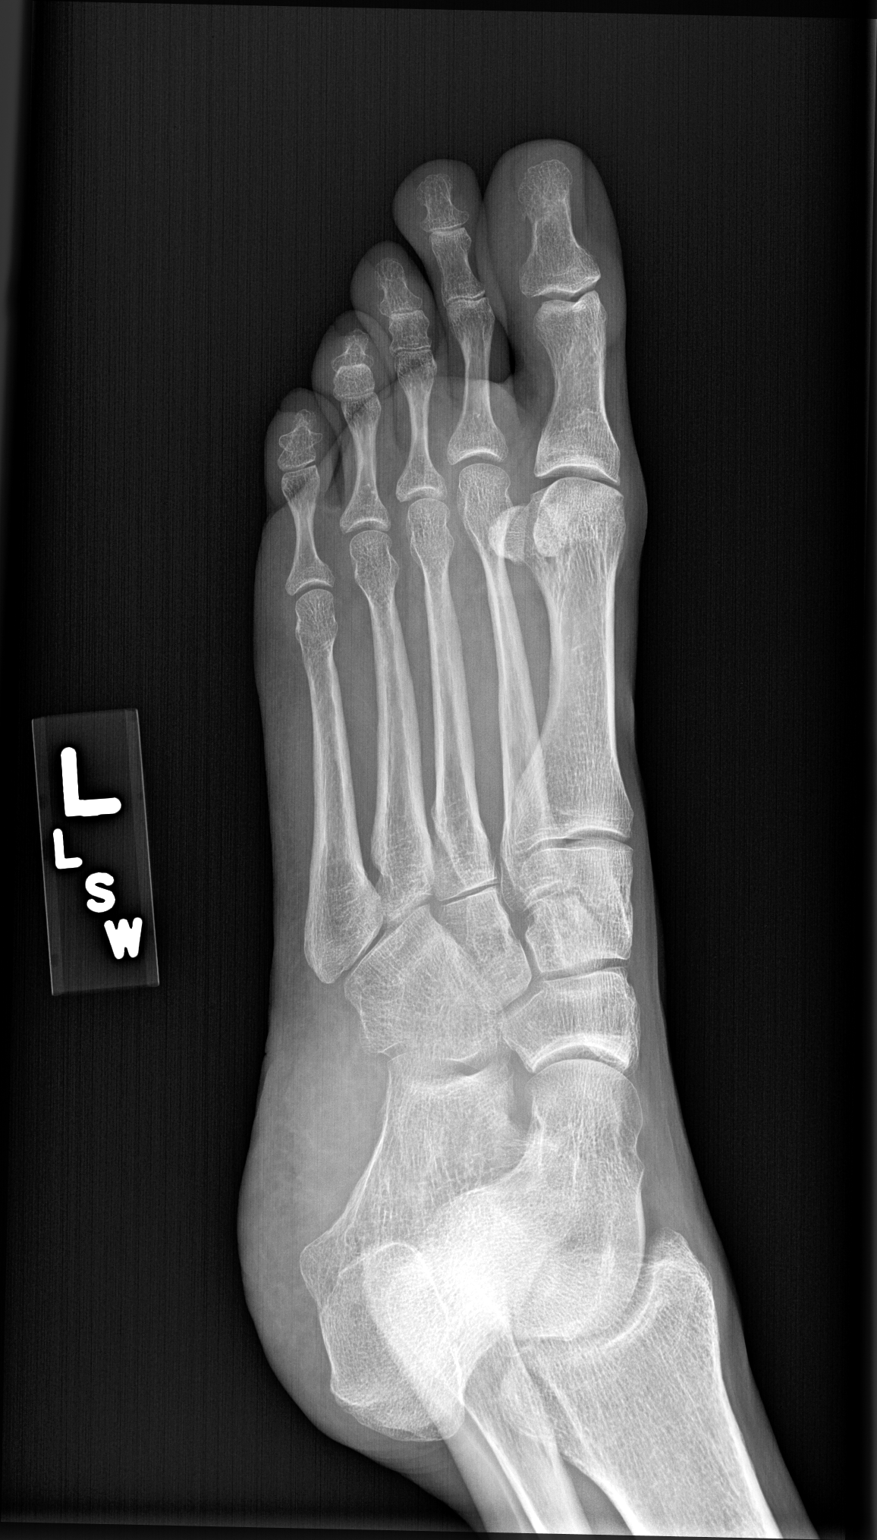

[foot lat]
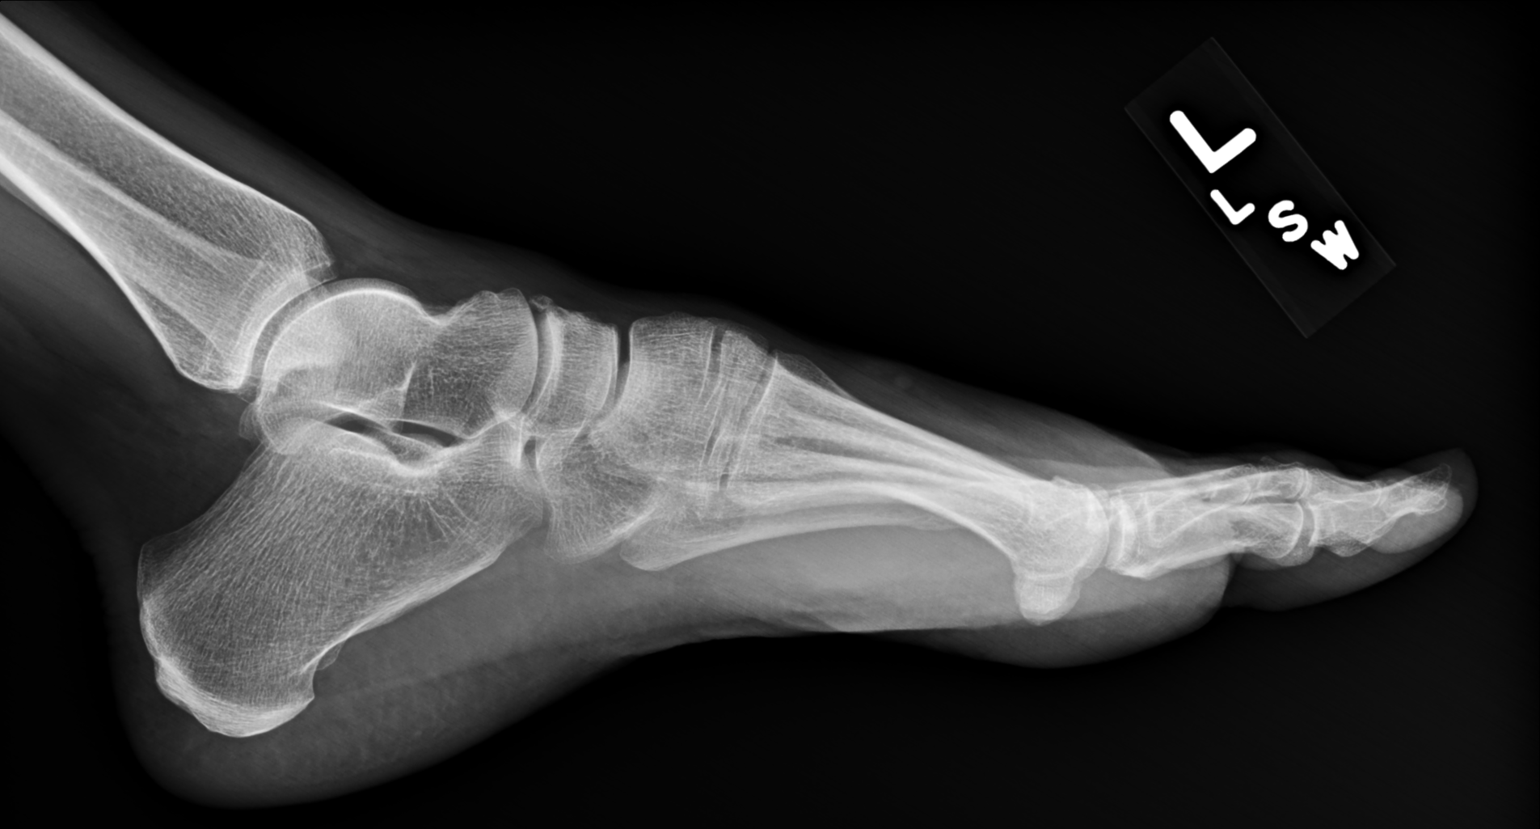

[3 of 3 positions shown; findings below may reference images not displayed]

FINDINGS: There is no evidence of fracture or dislocation. There is no
evidence of arthropathy or other focal bone abnormality. Soft
tissues are unremarkable.
IMPRESSION: Negative.

## 2022-10-07 DIAGNOSIS — R3915 Urgency of urination: Secondary | ICD-10-CM | POA: Diagnosis not present

## 2022-10-07 DIAGNOSIS — Z79890 Hormone replacement therapy (postmenopausal): Secondary | ICD-10-CM | POA: Diagnosis not present

## 2022-10-09 ENCOUNTER — Encounter: Payer: Self-pay | Admitting: Family Medicine

## 2022-10-09 DIAGNOSIS — K219 Gastro-esophageal reflux disease without esophagitis: Secondary | ICD-10-CM

## 2022-10-12 MED ORDER — PANTOPRAZOLE SODIUM 40 MG PO TBEC: 40.0000 mg | DELAYED_RELEASE_TABLET | Freq: Every day | ORAL | 3 refills | Status: DC

## 2022-12-16 ENCOUNTER — Telehealth: Payer: Self-pay | Admitting: Family Medicine

## 2022-12-16 ENCOUNTER — Other Ambulatory Visit: Payer: Self-pay | Admitting: Family Medicine

## 2022-12-16 DIAGNOSIS — K580 Irritable bowel syndrome with diarrhea: Secondary | ICD-10-CM

## 2022-12-16 DIAGNOSIS — Z9049 Acquired absence of other specified parts of digestive tract: Secondary | ICD-10-CM

## 2022-12-16 DIAGNOSIS — K219 Gastro-esophageal reflux disease without esophagitis: Secondary | ICD-10-CM

## 2022-12-16 MED ORDER — CHOLESTYRAMINE 4 G PO PACK: PACK | ORAL | 3 refills | Status: AC

## 2022-12-16 NOTE — Telephone Encounter (Signed)
Called and informed pt that medication has been sent in.

## 2022-12-16 NOTE — Telephone Encounter (Signed)
Patient called and wanted to know if she could get a refill on cholestyramine. She said she had it before. She also was concerned about having diaharra and weight lost. Her pharmacy she uses is [facility]. She would like for someone to contact her when its ready. Her number is [phone].

## 2022-12-28 DIAGNOSIS — Z1231 Encounter for screening mammogram for malignant neoplasm of breast: Secondary | ICD-10-CM | POA: Diagnosis not present

## 2022-12-31 ENCOUNTER — Ambulatory Visit: Payer: BC Managed Care – PPO | Admitting: Family Medicine

## 2022-12-31 ENCOUNTER — Encounter: Payer: Self-pay | Admitting: Family Medicine

## 2022-12-31 VITALS — BP 120/64 | HR 66 | Temp 97.9°F | Resp 16 | Ht 65.0 in | Wt 114.4 lb

## 2022-12-31 DIAGNOSIS — K580 Irritable bowel syndrome with diarrhea: Secondary | ICD-10-CM

## 2022-12-31 NOTE — Progress Notes (Signed)
SUBJECTIVE:   Chief Complaint  Patient presents with   Irritable Bowel Syndrome   HPI Patient presents for acute visit.  Patient reports history of IBSD Had been recently under control until about 2 month ago.  Went to visit son in different state.  Normally has symptoms of anxiety when visiting given strained relationship but reports this visit was exceptionally well.  She had some diarrhea after visit and requested refill for Questran.  Since restarting medication she has not had any further episodes of diarrhea.  She reports that she has an upcoming trip and has been wondering if she will have any diarrhea on trip.     PERTINENT PMH / PSH: IBSD  OBJECTIVE:  BP 120/64   Pulse 66   Temp 97.9 F (36.6 C)   Resp 16   Ht 5\' 5"  (1.651 m)   Wt 114 lb 6 oz (51.9 kg)   LMP  (LMP Unknown)   SpO2 96%   BMI 19.03 kg/m    Physical Exam Vitals reviewed.  Constitutional:      General: She is not in acute distress.    Appearance: Normal appearance. She is normal weight. She is not ill-appearing, toxic-appearing or diaphoretic.  Eyes:     General:        Right eye: No discharge.        Left eye: No discharge.     Conjunctiva/sclera: Conjunctivae normal.  Cardiovascular:     Rate and Rhythm: Normal rate and regular rhythm.     Heart sounds: Normal heart sounds.  Pulmonary:     Effort: Pulmonary effort is normal.     Breath sounds: Normal breath sounds.  Abdominal:     General: Abdomen is flat. Bowel sounds are normal. There is no distension.     Palpations: Abdomen is soft.     Tenderness: There is no abdominal tenderness. There is no right CVA tenderness, left CVA tenderness, guarding or rebound.  Musculoskeletal:        General: Normal range of motion.  Skin:    General: Skin is warm and dry.  Neurological:     General: No focal deficit present.     Mental Status: She is alert and oriented to person, place, and time. Mental status is at baseline.  Psychiatric:         Mood and Affect: Mood normal.        Behavior: Behavior normal.        Thought Content: Thought content normal.        Judgment: Judgment normal.        12/31/2022    2:54 PM 07/20/2022    3:05 PM 04/30/2022    2:10 PM 07/08/2021    3:00 PM 07/05/2020    8:53 AM  Depression screen PHQ 2/9  Decreased Interest 1 0 0 0 0  Down, Depressed, Hopeless 0 0 0 0 0  PHQ - 2 Score 1 0 0 0 0  Altered sleeping 0  3  0  Tired, decreased energy 0  0  0  Change in appetite 1  0  0  Feeling bad or failure about yourself  1  0  0  Trouble concentrating 0  0  0  Moving slowly or fidgety/restless 0  0  0  Suicidal thoughts 0  0  0  PHQ-9 Score 3  3  0  Difficult doing work/chores Somewhat difficult  Not difficult at all  Not difficult at all  12/31/2022    2:54 PM 07/20/2022    3:05 PM 04/30/2022    2:10 PM 07/05/2020    8:53 AM  GAD 7 : Generalized Anxiety Score  Nervous, Anxious, on Edge 2 1 0 0  Control/stop worrying 0 0 0 0  Worry too much - different things 0 0 0 0  Trouble relaxing 0 0 0 0  Restless 0 0 0 0  Easily annoyed or irritable 0 1 1 0  Afraid - awful might happen 2 0 0 0  Total GAD 7 Score 4 2 1  0  Anxiety Difficulty Somewhat difficult Not difficult at all Not difficult at all Not difficult at all    ASSESSMENT/PLAN:  Irritable bowel syndrome with diarrhea Assessment & Plan: Suspect increased anxiety about upcoming trip and thought of uncontrolled diarrhea contributing to IBSD Recommend she continue Questran as prescribed Increase soluble fiber Low FODMAP diet Follow up if no improvement    PDMP reviewed  Return if symptoms worsen or fail to improve, for PCP.  Dana Allan, MD

## 2022-12-31 NOTE — Patient Instructions (Addendum)
It was a pleasure meeting you today. Thank you for allowing me to take part in your health care.  Our goals for today as we discussed include:  Continue current medication   If you have any questions or concerns, please do not hesitate to call the office at [phone].  I look forward to our next visit and until then take care and stay safe.  Regards,   Dana Allan, MD   [facility]

## 2023-01-17 ENCOUNTER — Encounter: Payer: Self-pay | Admitting: Family Medicine

## 2023-01-17 NOTE — Assessment & Plan Note (Signed)
Suspect increased anxiety about upcoming trip and thought of uncontrolled diarrhea contributing to IBSD Recommend she continue Questran as prescribed Increase soluble fiber Low FODMAP diet Follow up if no improvement

## 2023-01-31 ENCOUNTER — Encounter: Payer: Self-pay | Admitting: Family Medicine

## 2023-02-01 NOTE — Telephone Encounter (Signed)
Left message to return call to our office.  

## 2023-02-02 NOTE — Telephone Encounter (Signed)
Spoke to pt and pharmacy and pt has 1 refill at the pharmacy. The pharmacy is filling for pt and I informed pt of this information.

## 2023-02-04 ENCOUNTER — Other Ambulatory Visit: Payer: Self-pay

## 2023-02-06 ENCOUNTER — Other Ambulatory Visit: Payer: Self-pay | Admitting: Family Medicine

## 2023-02-06 DIAGNOSIS — K219 Gastro-esophageal reflux disease without esophagitis: Secondary | ICD-10-CM

## 2023-02-06 MED ORDER — PANTOPRAZOLE SODIUM 40 MG PO TBEC: 40.0000 mg | DELAYED_RELEASE_TABLET | Freq: Every day | ORAL | 3 refills | Status: DC

## 2023-02-08 NOTE — Telephone Encounter (Signed)
Called and spoke to pt and she stated that she will pick up the rx for the 40 mg this week.

## 2023-04-27 ENCOUNTER — Telehealth: Payer: Self-pay

## 2023-04-27 ENCOUNTER — Ambulatory Visit: Payer: BC Managed Care – PPO | Admitting: Family Medicine

## 2023-04-27 ENCOUNTER — Encounter: Payer: Self-pay | Admitting: Family Medicine

## 2023-04-27 VITALS — BP 120/76 | HR 58 | Temp 98.1°F | Ht 65.0 in | Wt 116.6 lb

## 2023-04-27 DIAGNOSIS — R350 Frequency of micturition: Secondary | ICD-10-CM

## 2023-04-27 LAB — POC URINALSYSI DIPSTICK (AUTOMATED)
Bilirubin, UA: NEGATIVE
Blood, UA: NEGATIVE
Glucose, UA: NEGATIVE
Ketones, UA: NEGATIVE
Leukocytes, UA: NEGATIVE
Nitrite, UA: NEGATIVE
Protein, UA: NEGATIVE
Spec Grav, UA: 1.01 (ref 1.010–1.025)
Urobilinogen, UA: 0.2 U/dL
pH, UA: 7 (ref 5.0–8.0)

## 2023-04-27 NOTE — Telephone Encounter (Signed)
Pt need a PA for Pantoprazole 40 MG tab

## 2023-04-27 NOTE — Progress Notes (Signed)
  Marikay Alar, MD Phone: [phone]  Stacey [name] is a 52 y.o. female who presents today for same-day visit.  Urine frequency: Patient reports urine frequency for 2 days.  Notes no urgency, hematuria, dysuria, abdominal pain, or vaginal discharge.  She has been constipated though she notes this is a chronic issue.  No history of diabetes.  Social History   Tobacco Use  Smoking Status Never  Smokeless Tobacco Never    Current Outpatient Medications on File Prior to Visit  Medication Sig Dispense Refill   calcium carbonate (OS-CAL) 600 MG tablet Take 600 mg by mouth daily.     cholestyramine (QUESTRAN) 4 g packet USE 1 PACKET MIXED AS DIRECTED DAILY 180 each 3   clobetasol cream (TEMOVATE) 0.05 % Apply 1 application. topically 2 (two) times daily. Prn elbows 60 g 2   COLLAGEN PO Take by mouth.     estradiol (VIVELLE-DOT) 0.05 MG/24HR patch Apply 1 patch twice a week by transdermal route for 28 days.     Misc Natural Products (OSTEO BI-FLEX/5-LOXIN ADVANCED PO)      Multiple Vitamin (MULTI-VITAMINS PO)      pantoprazole (PROTONIX) 40 MG tablet Take 1 tablet (40 mg total) by mouth daily. 90 tablet 3   Probiotic Product (PROBIOTIC-10 PO)      No current facility-administered medications on file prior to visit.     ROS see history of present illness  Objective  Physical Exam Vitals:   04/27/23 0957  BP: 120/76  Pulse: (!) 58  Temp: 98.1 F (36.7 C)  SpO2: 99%    BP Readings from Last 3 Encounters:  04/27/23 120/76  12/31/22 120/64  07/20/22 90/60   Wt Readings from Last 3 Encounters:  04/27/23 116 lb 9.6 oz (52.9 kg)  12/31/22 114 lb 6 oz (51.9 kg)  07/20/22 119 lb 3.2 oz (54.1 kg)    Physical Exam Abdominal:     General: Bowel sounds are normal. There is no distension.     Palpations: Abdomen is soft.     Tenderness: There is no abdominal tenderness.      Assessment/Plan: Please see individual problem list.  Urine frequency Assessment &  Plan: New onset issue.  Urine dipstick is negative.  Will send for culture to rule out infection out.  If her urine culture reveals infection we will treat appropriately.  If urine culture does not reveal infection and her symptoms are persistent would consider treatment for overactive bladder.  Orders: -     POCT Urinalysis Dipstick (Automated) -     Urine Culture     Return if symptoms worsen or fail to improve.   Marikay Alar, MD [facility]

## 2023-04-27 NOTE — Assessment & Plan Note (Signed)
New onset issue.  Urine dipstick is negative.  Will send for culture to rule out infection out.  If her urine culture reveals infection we will treat appropriately.  If urine culture does not reveal infection and her symptoms are persistent would consider treatment for overactive bladder.

## 2023-04-28 LAB — URINE CULTURE
MICRO NUMBER:: 15860866
Result:: NO GROWTH
SPECIMEN QUALITY:: ADEQUATE

## 2023-04-29 ENCOUNTER — Encounter: Payer: Self-pay | Admitting: Family Medicine

## 2023-04-30 ENCOUNTER — Other Ambulatory Visit (HOSPITAL_COMMUNITY): Payer: Self-pay

## 2023-04-30 ENCOUNTER — Telehealth: Payer: Self-pay

## 2023-04-30 ENCOUNTER — Other Ambulatory Visit: Payer: Self-pay | Admitting: Family Medicine

## 2023-04-30 DIAGNOSIS — K219 Gastro-esophageal reflux disease without esophagitis: Secondary | ICD-10-CM

## 2023-04-30 NOTE — Telephone Encounter (Signed)
Pharmacy Patient Advocate Encounter   Received notification from Pt Calls Messages that prior authorization for Pantoprazole Sodium 40MG  dr tablets is required/requested.   Insurance verification completed.   The patient is insured through [facility] .   Per test claim: PA required; PA submitted to above mentioned insurance via CoverMyMeds Key/confirmation #/EOC [id number] Status is pending

## 2023-04-30 NOTE — Telephone Encounter (Signed)
PA request has been Submitted. New Encounter created for follow up. For additional info see Pharmacy Prior Auth telephone encounter from 04/30/23.

## 2023-04-30 NOTE — Telephone Encounter (Signed)
Pharmacy Patient Advocate Encounter  Received notification from [facility] that Prior Authorization for Pantoprazole Sodium 40MG  dr tablets  has been APPROVED from 04/30/23 to 04/29/24. Spoke to pharmacy to process.Copay is $27.25 for 90 days.    PA #/Case ID/Reference #: [id number]

## 2023-04-30 NOTE — Telephone Encounter (Signed)
Pt informed PA was approved.

## 2023-04-30 NOTE — Telephone Encounter (Signed)
Called pt and informed her that PA was approved.

## 2023-05-03 ENCOUNTER — Other Ambulatory Visit: Payer: Self-pay

## 2023-05-03 DIAGNOSIS — K219 Gastro-esophageal reflux disease without esophagitis: Secondary | ICD-10-CM

## 2023-05-04 ENCOUNTER — Other Ambulatory Visit: Payer: Self-pay

## 2023-05-04 MED ORDER — PANTOPRAZOLE SODIUM 40 MG PO TBEC: 40.0000 mg | DELAYED_RELEASE_TABLET | Freq: Every day | ORAL | 3 refills | Status: DC

## 2023-05-13 IMAGING — MG MM DIGITAL SCREENING BILAT W/ TOMO AND CAD
6 of 10 series · 6 of 30 positions shown · non-contrast
Comparison: Previous exam(s).

CLINICAL DATA: Screening.

EXAM:
DIGITAL SCREENING BILATERAL MAMMOGRAM WITH TOMOSYNTHESIS AND CAD
TECHNIQUE: Bilateral screening digital craniocaudal and mediolateral oblique
mammograms were obtained. Bilateral screening digital breast
tomosynthesis was performed. The images were evaluated with
computer-aided detection.

[L CC synth-2D]
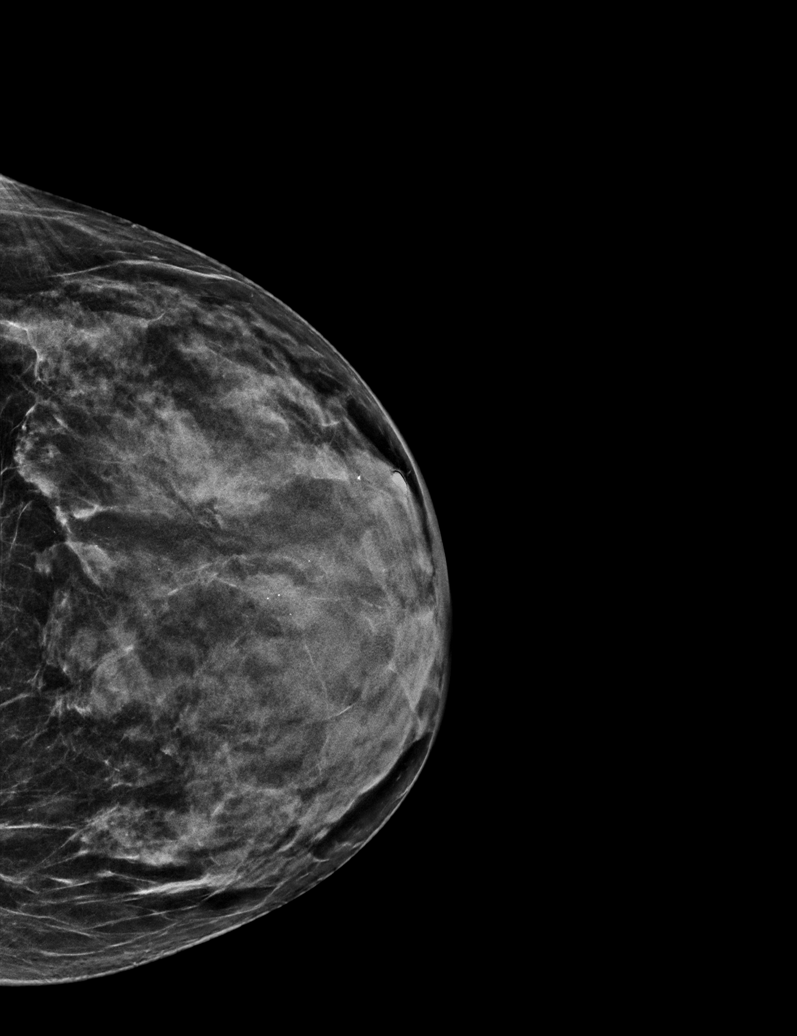

[L MLO synth-2D (1 of 2)]
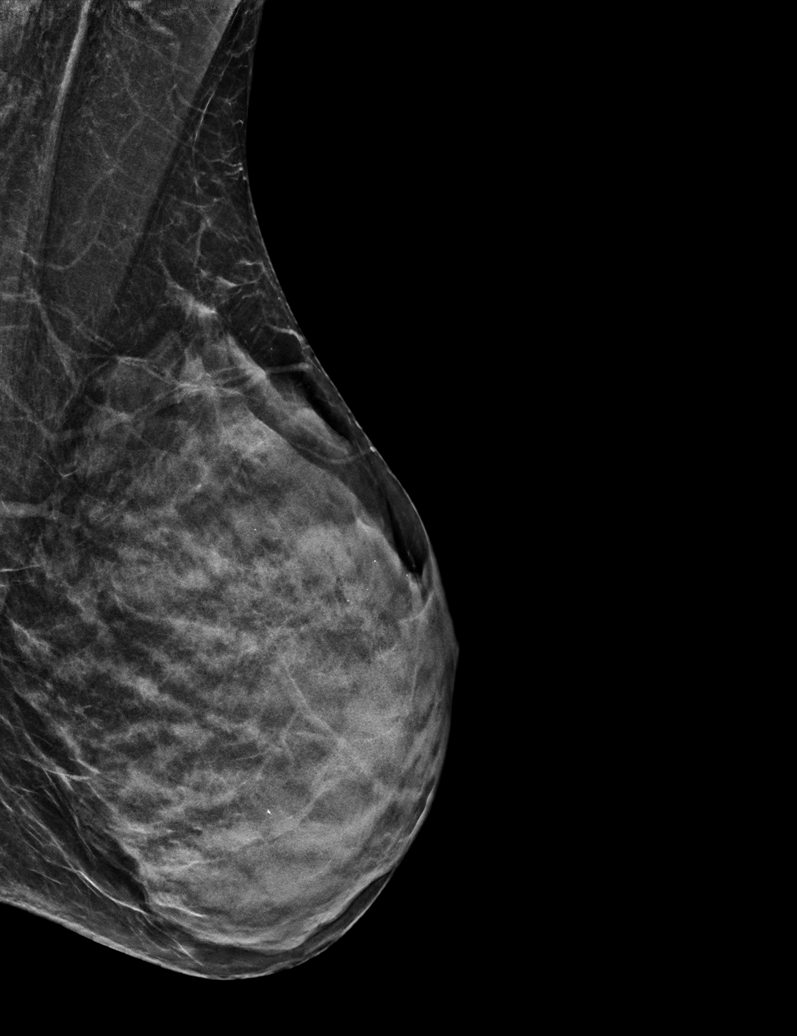

[R CC synth-2D]
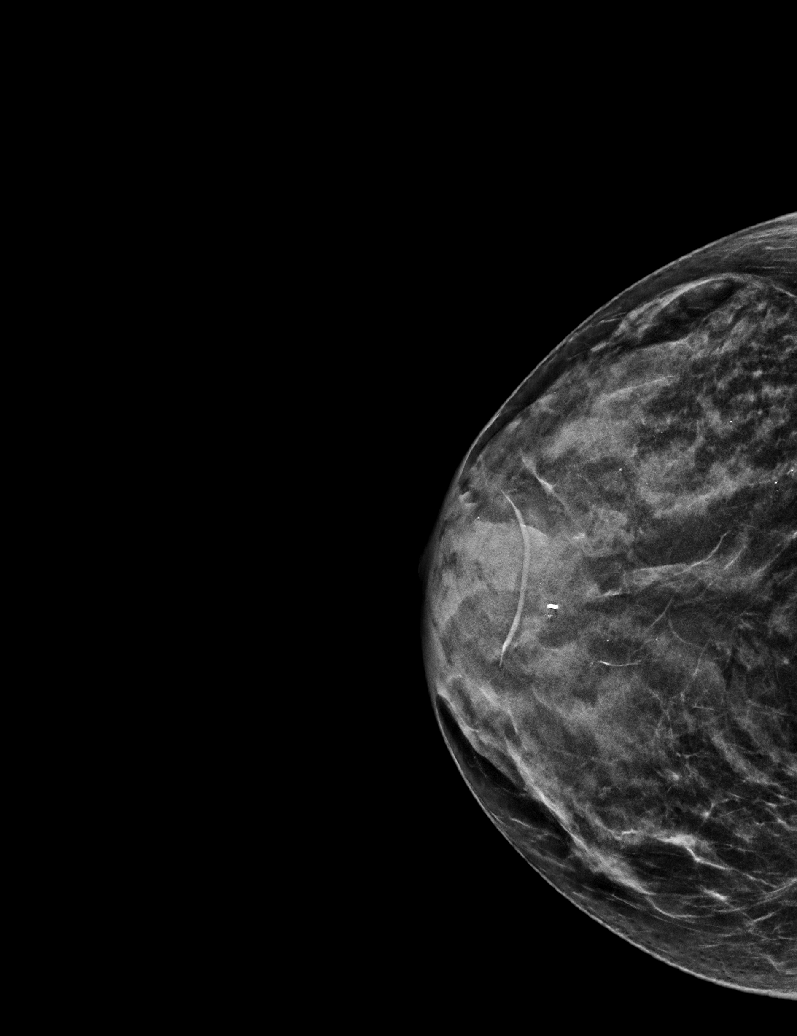

[R MLO synth-2D]
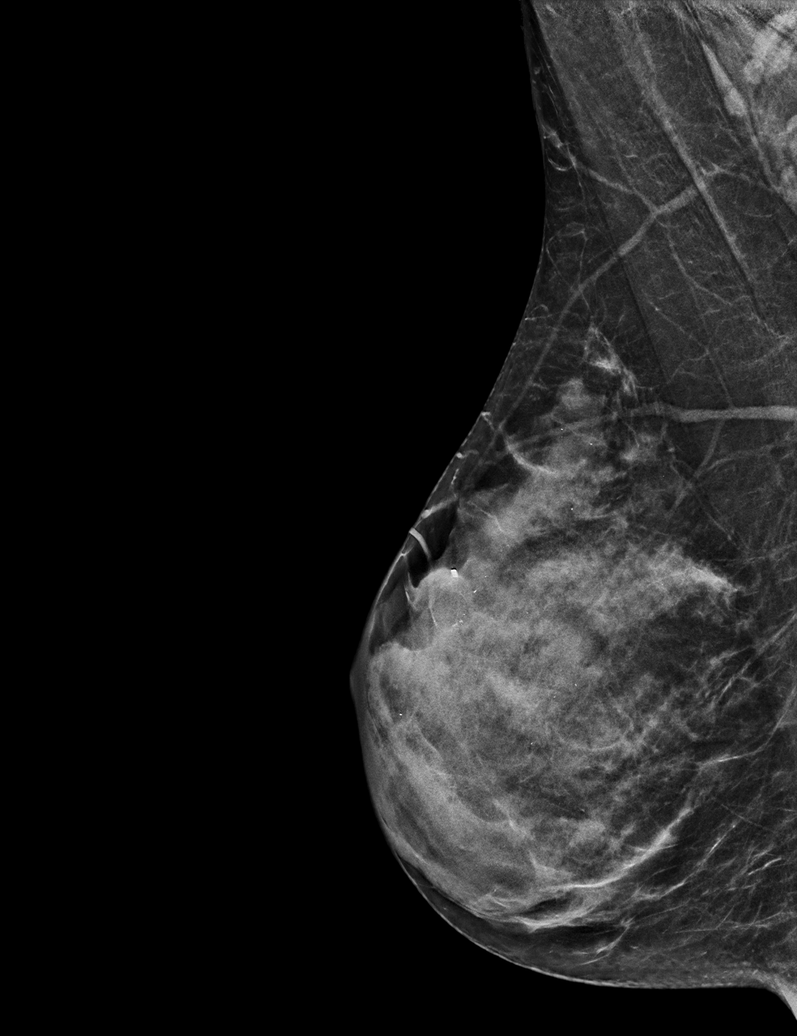

[L MLO synth-2D (2 of 2)]
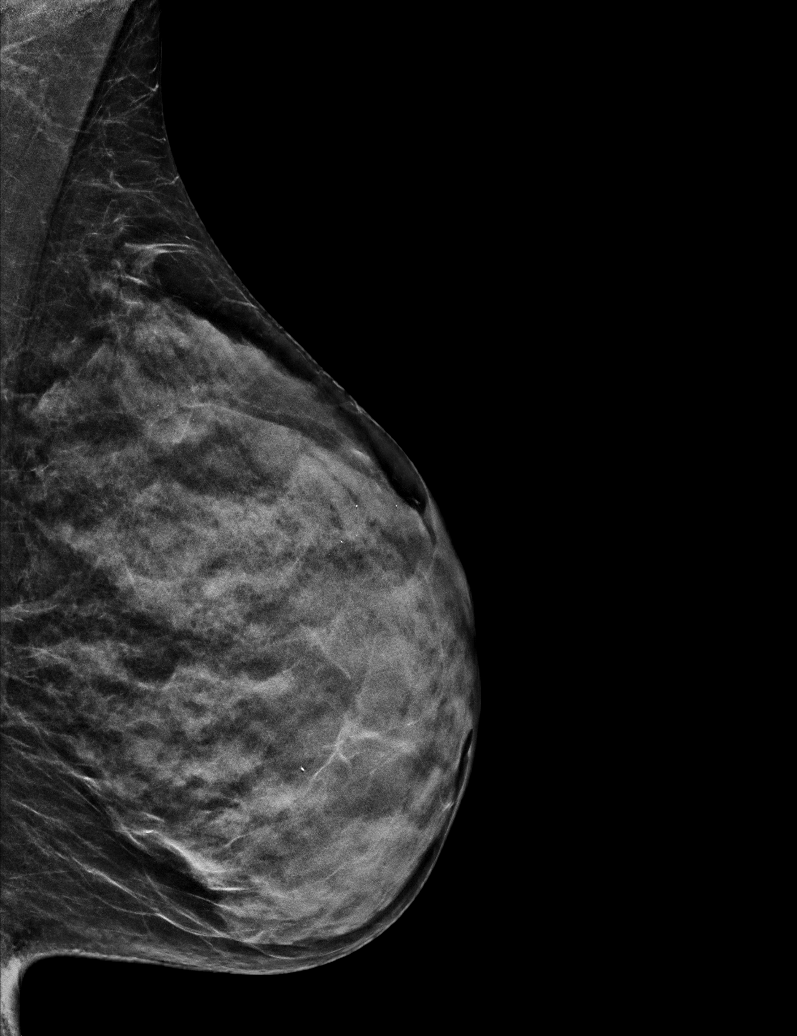

[L MLO tomo · tomo slice 21/42.0]
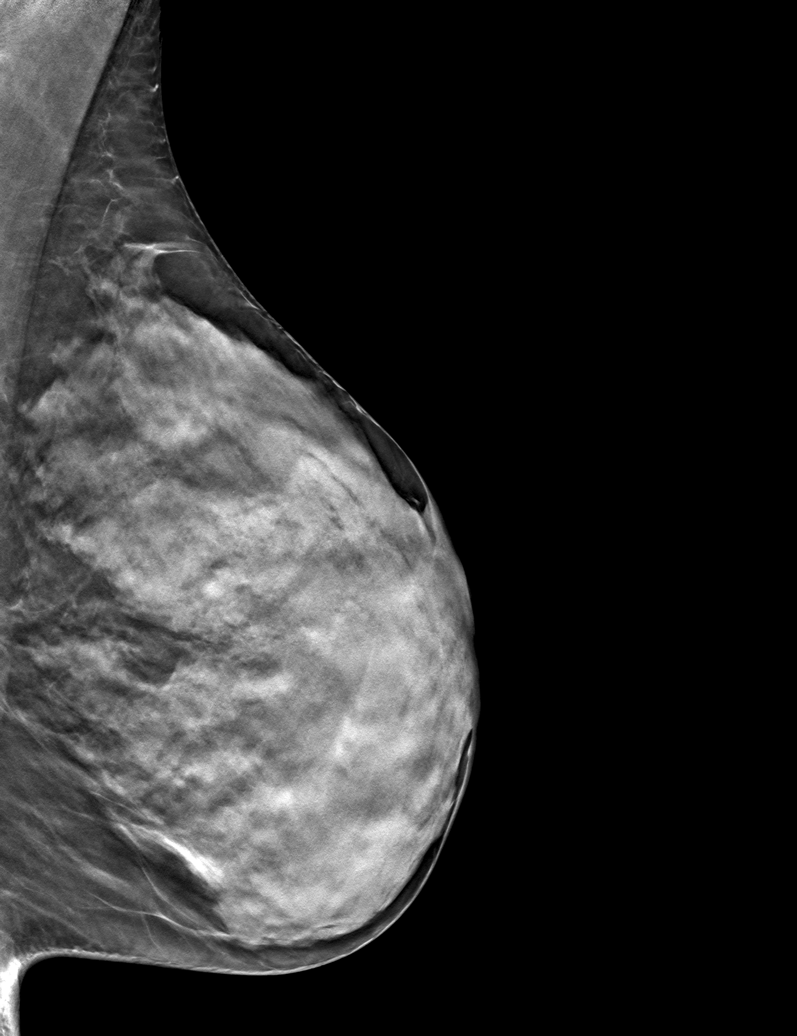

[6 of 30 positions shown; findings below may reference images not displayed]

ACR Breast Density Category d: The breast tissue is extremely dense,
which lowers the sensitivity of mammography
FINDINGS: There are no findings suspicious for malignancy.
IMPRESSION: No mammographic evidence of malignancy. A result letter of this
screening mammogram will be mailed directly to the patient.

RECOMMENDATION:
Screening mammogram in one year. (Code:TA-V-WV9)

BI-RADS CATEGORY  1: Negative.

## 2023-08-31 ENCOUNTER — Ambulatory Visit: Admitting: Internal Medicine

## 2023-08-31 ENCOUNTER — Encounter: Payer: Self-pay | Admitting: Internal Medicine

## 2023-08-31 VITALS — BP 100/56 | HR 76 | Ht 65.0 in | Wt 113.8 lb

## 2023-08-31 DIAGNOSIS — R0989 Other specified symptoms and signs involving the circulatory and respiratory systems: Secondary | ICD-10-CM

## 2023-08-31 DIAGNOSIS — I9589 Other hypotension: Secondary | ICD-10-CM | POA: Insufficient documentation

## 2023-08-31 DIAGNOSIS — E538 Deficiency of other specified B group vitamins: Secondary | ICD-10-CM | POA: Diagnosis not present

## 2023-08-31 DIAGNOSIS — E785 Hyperlipidemia, unspecified: Secondary | ICD-10-CM

## 2023-08-31 DIAGNOSIS — K582 Mixed irritable bowel syndrome: Secondary | ICD-10-CM

## 2023-08-31 DIAGNOSIS — R5383 Other fatigue: Secondary | ICD-10-CM | POA: Diagnosis not present

## 2023-08-31 DIAGNOSIS — R7301 Impaired fasting glucose: Secondary | ICD-10-CM | POA: Diagnosis not present

## 2023-08-31 DIAGNOSIS — N9489 Other specified conditions associated with female genital organs and menstrual cycle: Secondary | ICD-10-CM

## 2023-08-31 NOTE — Assessment & Plan Note (Signed)
Chronic. Recently started estradiol 0.05 mg twice daily. Continue to follow-up with OB/GYN

## 2023-08-31 NOTE — Assessment & Plan Note (Addendum)
 Primary problem appears to be both constipation followed by diarrhea  on a daily  basis.  She is managed with prn us  of  Questran ,  which results in CONSTIPATION if she uses it daily   Recommending trial of daily citrucel  plus/minus stool softener .  If constipation persists, trial of Linzess

## 2023-08-31 NOTE — Assessment & Plan Note (Signed)
Aortic ultrasound ordered  °

## 2023-08-31 NOTE — Progress Notes (Signed)
 Subjective:  Patient ID: Stacey [name], female    DOB: [birth year]  Age: 53 y.o. MRN: [id number]  CC: The primary encounter diagnosis was B12 deficiency. Diagnoses of Impaired fasting glucose, Other fatigue, Dyslipidemia, Other specified hypotension, Abdominal bruit, Irritable bowel syndrome with both constipation and diarrhea, Female pelvic congestion syndrome, Hypotension, chronic, and Abdominal aortic bruit were also pertinent to this visit.   HPI Stacey [name] presents for  Chief Complaint  Patient presents with   Transfer of Care    Increased problems with  stooling  .  Starts out with hard small caliber stools  progresses to watery stools several in the same day. Has never tried osmotic laxatives or bulk forming laxatives.  Only Questran   Had a 13 lb  weight loss at some point during the year.  Normal colonoscopy 2024 .  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  Stacey [name]  Has been using Questran  (prescribed remotely by GI for diarrhea)  prn after no BMS in 2-3 days because daily use made her constipated.    2 low BP readings  for the last week  99/48    Outpatient Medications Prior to Visit  Medication Sig Dispense Refill   calcium-vitamin D  (OSCAL WITH D) 500-5 MG-MCG tablet Take 1 tablet by mouth daily.     cholestyramine  (QUESTRAN ) 4 g packet USE 1 PACKET MIXED AS DIRECTED DAILY 180 each 3   clobetasol  cream (TEMOVATE ) 0.05 % Apply 1 application. topically 2 (two) times daily. Prn elbows 60 g 2   COLLAGEN PO Take by mouth.     estradiol (VIVELLE-DOT) 0.05 MG/24HR patch Apply 1 patch twice a week by transdermal route for 28 days.     Multiple Vitamin (MULTI-VITAMINS PO)      pantoprazole  (PROTONIX ) 40 MG tablet Take 1 tablet (40 mg total) by mouth daily. 90 tablet 3   Probiotic Product (ALIGN PO) Take 1 capsule by mouth daily.     calcium carbonate (OS-CAL) 600 MG tablet Take 600 mg by mouth daily. (Patient not taking: Reported on 08/31/2023)     Misc Natural Products  (OSTEO BI-FLEX/5-LOXIN ADVANCED PO)      Probiotic Product (PROBIOTIC-10 PO)  (Patient not taking: Reported on 08/31/2023)     No facility-administered medications prior to visit.    Review of Systems;  Patient denies headache, fevers, malaise, unintentional weight loss, skin rash, eye pain, sinus congestion and sinus pain, sore throat, dysphagia,  hemoptysis , cough, dyspnea, wheezing, chest pain, palpitations, orthopnea, edema, abdominal pain, nausea, melena, diarrhea, constipation, flank pain, dysuria, hematuria, urinary  Frequency, nocturia, numbness, tingling, seizures,  Focal weakness, Loss of consciousness,  Tremor, insomnia, depression, anxiety, and suicidal ideation.      Objective:  BP (!) 100/56   Pulse 76   Ht 5\' 5"  (1.651 m)   Wt 113 lb 12.8 oz (51.6 kg)   LMP  (LMP Unknown)   SpO2 96%   BMI 18.94 kg/m   BP Readings from Last 3 Encounters:  08/31/23 (!) 100/56  04/27/23 120/76  12/31/22 120/64    Wt Readings from Last 3 Encounters:  08/31/23 113 lb 12.8 oz (51.6 kg)  04/27/23 116 lb 9.6 oz (52.9 kg)  12/31/22 114 lb 6 oz (51.9 kg)    Physical Exam Vitals reviewed.  Constitutional:      General: She is not in acute distress.    Appearance: Normal appearance. She is normal weight. She is not ill-appearing, toxic-appearing or diaphoretic.  HENT:  Head: Normocephalic.  Eyes:     General: No scleral icterus.       Right eye: No discharge.        Left eye: No discharge.     Conjunctiva/sclera: Conjunctivae normal.  Cardiovascular:     Rate and Rhythm: Normal rate and regular rhythm.     Heart sounds: Normal heart sounds.  Pulmonary:     Effort: Pulmonary effort is normal. No respiratory distress.     Breath sounds: Normal breath sounds.  Musculoskeletal:        General: Normal range of motion.  Skin:    General: Skin is warm and dry.  Neurological:     General: No focal deficit present.     Mental Status: She is alert and oriented to person, place,  and time. Mental status is at baseline.  Psychiatric:        Mood and Affect: Mood normal.        Behavior: Behavior normal.        Thought Content: Thought content normal.        Judgment: Judgment normal.    Lab Results  Component Value Date   HGBA1C 5.2 07/16/2022    Lab Results  Component Value Date   CREATININE 0.90 07/16/2022   CREATININE 0.99 07/09/2021   CREATININE 0.91 07/05/2020    Lab Results  Component Value Date   WBC 3.7 (L) 07/16/2022   HGB 13.8 07/16/2022   HCT 40.3 07/16/2022   PLT 167.0 07/16/2022   GLUCOSE 86 07/16/2022   CHOL 177 07/16/2022   TRIG 48.0 07/16/2022   HDL 68.00 07/16/2022   LDLCALC 99 07/16/2022   ALT 14 07/16/2022   AST 19 07/16/2022   NA 142 07/16/2022   K 3.9 07/16/2022   CL 103 07/16/2022   CREATININE 0.90 07/16/2022   BUN 15 07/16/2022   CO2 30 07/16/2022   TSH 2.37 07/16/2022   HGBA1C 5.2 07/16/2022    No results found.  Assessment & Plan:  .B12 deficiency -     Intrinsic Factor Antibodies -     B12 and Folate Panel  Impaired fasting glucose -     Comprehensive metabolic panel with GFR -     Hemoglobin A1c  Other fatigue -     CBC with Differential/Platelet -     TSH  Dyslipidemia -     Lipid panel -     LDL cholesterol, direct  Other specified hypotension -     Cortisol-am, blood; Future  Abdominal bruit -     US  AORTA; Future  Irritable bowel syndrome with both constipation and diarrhea Assessment & Plan: Primary problem appears to be diarrhea  which she manages with Questran ,  which results in diarrhea.  Recommending trial of citrucel  plus/minus stool softener .  If constipation persists, trial of Linzess    Female pelvic congestion syndrome Assessment & Plan: Chronic. Recently started estradiol 0.05 mg twice daily. Continue to follow-up with OB/GYN     Hypotension, chronic Assessment & Plan: Chronic, without orthostasis .  Checking an AM cortisol    Abdominal aortic bruit Assessment &  Plan: Aortic ultrasound ordered       I spent 40 minutes on the day of this face to face encounter reviewing patient's   prior relevant surgical and non surgical procedures, recent  labs and imaging studies, counseling on IBS management   reviewing the assessment and plan with patient, and post visit ordering and reviewing of  diagnostics and  therapeutics with patient  .   Follow-up: Return in about 4 weeks (around 09/28/2023).   Thersia Flax, MD

## 2023-08-31 NOTE — Assessment & Plan Note (Signed)
 Chronic, without orthostasis .  Checking an AM cortisol

## 2023-08-31 NOTE — Patient Instructions (Addendum)
 1) For the bowel issues:  let's start with some simple natural  remendies before we try Linzess or Amiriza (or trulance):   I want you to take a serving of Benefiber or citrucel every night for one week  Ok to add docusate (stool softener) if needed  : 100 mg max dose   You Can combine with questran  if diarrhea occurs.    Send me some updates via mychart.     2) the low BP may be due to low cortisol levels.. return for an early AM cortisol level You can increase your BP by adding salt ( as tolerated) to your diet    3)  Ultrasound of the aorta ordered

## 2023-09-01 LAB — COMPREHENSIVE METABOLIC PANEL WITH GFR
ALT: 13 U/L (ref 0–35)
AST: 19 U/L (ref 0–37)
Albumin: 4.4 g/dL (ref 3.5–5.2)
Alkaline Phosphatase: 25 U/L — ABNORMAL LOW (ref 39–117)
BUN: 13 mg/dL (ref 6–23)
CO2: 30 meq/L (ref 19–32)
Calcium: 9.4 mg/dL (ref 8.4–10.5)
Chloride: 101 meq/L (ref 96–112)
Creatinine, Ser: 0.95 mg/dL (ref 0.40–1.20)
GFR: 68.11 mL/min (ref >60.00)
Glucose, Bld: 93 mg/dL (ref 70–99)
Potassium: 3.7 meq/L (ref 3.5–5.1)
Sodium: 139 meq/L (ref 135–145)
Total Bilirubin: 0.7 mg/dL (ref 0.2–1.2)
Total Protein: 6.6 g/dL (ref 6.0–8.3)

## 2023-09-01 LAB — CBC WITH DIFFERENTIAL/PLATELET
Basophils Absolute: 0 10*3/uL (ref 0.0–0.1)
Basophils Relative: 0.7 % (ref 0.0–3.0)
Eosinophils Absolute: 0 10*3/uL (ref 0.0–0.7)
Eosinophils Relative: 0.3 % (ref 0.0–5.0)
HCT: 39.6 % (ref 36.0–46.0)
Hemoglobin: 13.6 g/dL (ref 12.0–15.0)
Lymphocytes Relative: 24.9 % (ref 12.0–46.0)
Lymphs Abs: 1.1 10*3/uL (ref 0.7–4.0)
MCHC: 34.3 g/dL (ref 30.0–36.0)
MCV: 94.8 fl (ref 78.0–100.0)
Monocytes Absolute: 0.3 10*3/uL (ref 0.1–1.0)
Monocytes Relative: 5.8 % (ref 3.0–12.0)
Neutro Abs: 3 10*3/uL (ref 1.4–7.7)
Neutrophils Relative %: 68.3 % (ref 43.0–77.0)
Platelets: 148 10*3/uL — ABNORMAL LOW (ref 150.0–400.0)
RBC: 4.18 Mil/uL (ref 3.87–5.11)
RDW: 12.3 % (ref 11.5–15.5)
WBC: 4.3 10*3/uL (ref 4.0–10.5)

## 2023-09-01 LAB — LIPID PANEL
Cholesterol: 149 mg/dL (ref 0–200)
HDL: 68.8 mg/dL (ref >39.00)
LDL Cholesterol: 65 mg/dL (ref 0–99)
NonHDL: 79.76
Total CHOL/HDL Ratio: 2
Triglycerides: 74 mg/dL (ref 0.0–149.0)
VLDL: 14.8 mg/dL (ref 0.0–40.0)

## 2023-09-01 LAB — LDL CHOLESTEROL, DIRECT: Direct LDL: 67 mg/dL

## 2023-09-01 LAB — B12 AND FOLATE PANEL
Folate: 21.8 ng/mL (ref >5.9)
Vitamin B-12: 750 pg/mL (ref 211–911)

## 2023-09-01 LAB — TSH: TSH: 1.26 u[IU]/mL (ref 0.35–5.50)

## 2023-09-01 LAB — HEMOGLOBIN A1C: Hgb A1c MFr Bld: 5.1 % (ref 4.6–6.5)

## 2023-09-02 ENCOUNTER — Encounter: Payer: Self-pay | Admitting: Internal Medicine

## 2023-09-02 ENCOUNTER — Other Ambulatory Visit (INDEPENDENT_AMBULATORY_CARE_PROVIDER_SITE_OTHER)

## 2023-09-02 DIAGNOSIS — I9589 Other hypotension: Secondary | ICD-10-CM | POA: Diagnosis not present

## 2023-09-03 ENCOUNTER — Encounter: Payer: Self-pay | Admitting: Internal Medicine

## 2023-09-03 LAB — INTRINSIC FACTOR ANTIBODIES: Intrinsic Factor: NEGATIVE

## 2023-09-03 LAB — CORTISOL-AM, BLOOD: Cortisol - AM: 16.7 ug/dL

## 2023-09-07 ENCOUNTER — Other Ambulatory Visit: Payer: Self-pay | Admitting: Internal Medicine

## 2023-09-07 ENCOUNTER — Ambulatory Visit
Admission: RE | Admit: 2023-09-07 | Discharge: 2023-09-07 | Disposition: A | Discharge status: Home / Self Care | Source: Ambulatory Visit | Attending: Internal Medicine | Admitting: Internal Medicine

## 2023-09-07 DIAGNOSIS — R7301 Impaired fasting glucose: Secondary | ICD-10-CM

## 2023-09-07 DIAGNOSIS — E785 Hyperlipidemia, unspecified: Secondary | ICD-10-CM | POA: Diagnosis not present

## 2023-09-07 DIAGNOSIS — R0989 Other specified symptoms and signs involving the circulatory and respiratory systems: Secondary | ICD-10-CM | POA: Diagnosis not present

## 2023-09-07 DIAGNOSIS — N9489 Other specified conditions associated with female genital organs and menstrual cycle: Secondary | ICD-10-CM

## 2023-09-07 DIAGNOSIS — E538 Deficiency of other specified B group vitamins: Secondary | ICD-10-CM

## 2023-09-07 DIAGNOSIS — I9589 Other hypotension: Secondary | ICD-10-CM

## 2023-09-07 DIAGNOSIS — K582 Mixed irritable bowel syndrome: Secondary | ICD-10-CM

## 2023-09-07 DIAGNOSIS — R5383 Other fatigue: Secondary | ICD-10-CM

## 2023-09-09 ENCOUNTER — Encounter: Payer: Self-pay | Admitting: Internal Medicine

## 2023-09-29 ENCOUNTER — Ambulatory Visit: Admitting: Internal Medicine

## 2023-09-29 ENCOUNTER — Encounter: Payer: Self-pay | Admitting: Internal Medicine

## 2023-09-29 VITALS — BP 110/70 | HR 67 | Temp 97.8°F | Ht 65.0 in | Wt 111.8 lb

## 2023-09-29 DIAGNOSIS — K219 Gastro-esophageal reflux disease without esophagitis: Secondary | ICD-10-CM | POA: Diagnosis not present

## 2023-09-29 DIAGNOSIS — R0989 Other specified symptoms and signs involving the circulatory and respiratory systems: Secondary | ICD-10-CM

## 2023-09-29 DIAGNOSIS — K580 Irritable bowel syndrome with diarrhea: Secondary | ICD-10-CM | POA: Diagnosis not present

## 2023-09-29 DIAGNOSIS — Z1231 Encounter for screening mammogram for malignant neoplasm of breast: Secondary | ICD-10-CM | POA: Diagnosis not present

## 2023-09-29 MED ORDER — ALPRAZOLAM 0.25 MG PO TABS: 0.2500 mg | ORAL_TABLET | Freq: Two times a day (BID) | ORAL | 0 refills | Status: DC | PRN

## 2023-09-29 MED ORDER — DEXLANSOPRAZOLE 30 MG PO CPDR: 30.0000 mg | DELAYED_RELEASE_CAPSULE | Freq: Every day | ORAL | 2 refills | Status: DC

## 2023-09-29 NOTE — Progress Notes (Unsigned)
 Subjective:  Patient ID: Stacey [name], female    DOB: 1971/02/27  Age: 53 y.o. MRN: [id number]  CC: There were no encounter diagnoses.   HPI Stacey [name] presents for  Chief Complaint  Patient presents with   Medical Management of Chronic Issues   1) follow up on constipation/diarrhea. After last visit she started benefiber, had a week of normal BM 's using colace daily.  However, during her vacation in [place] ,   the diarrhea returned .  Along with indigestion .  She started using the questran  She was advised to stop the stool softener and started Benefiber   2) GERD:  has had 8 of the last 30 days of uncontrolled symptoms despite taking pantoprazole  correctly in the AM. Addition of famotidine  has helped      Outpatient Medications Prior to Visit  Medication Sig Dispense Refill   calcium-vitamin D  (OSCAL WITH D) 500-5 MG-MCG tablet Take 1 tablet by mouth daily.     cholestyramine  (QUESTRAN ) 4 g packet USE 1 PACKET MIXED AS DIRECTED DAILY 180 each 3   clobetasol  cream (TEMOVATE ) 0.05 % Apply 1 application. topically 2 (two) times daily. Prn elbows 60 g 2   COLLAGEN PO Take by mouth.     estradiol (VIVELLE-DOT) 0.05 MG/24HR patch Apply 1 patch twice a week by transdermal route for 28 days.     Multiple Vitamin (MULTI-VITAMINS PO)      pantoprazole  (PROTONIX ) 40 MG tablet Take 1 tablet (40 mg total) by mouth daily. 90 tablet 3   Probiotic Product (ALIGN PO) Take 1 capsule by mouth daily.     No facility-administered medications prior to visit.    Review of Systems;  Patient denies headache, fevers, malaise, unintentional weight loss, skin rash, eye pain, sinus congestion and sinus pain, sore throat, dysphagia,  hemoptysis , cough, dyspnea, wheezing, chest pain, palpitations, orthopnea, edema, abdominal pain, nausea, melena, diarrhea, constipation, flank pain, dysuria, hematuria, urinary  Frequency, nocturia, numbness, tingling, seizures,  Focal weakness, Loss of  consciousness,  Tremor, insomnia, depression, anxiety, and suicidal ideation.      Objective:  BP 110/70   Pulse 67   Temp 97.8 F (36.6 C) (Oral)   Ht 5\' 5"  (1.651 m)   Wt 111 lb 12.8 oz (50.7 kg)   LMP  (LMP Unknown)   SpO2 98%   BMI 18.60 kg/m   BP Readings from Last 3 Encounters:  09/29/23 110/70  08/31/23 (!) 100/56  04/27/23 120/76    Wt Readings from Last 3 Encounters:  09/29/23 111 lb 12.8 oz (50.7 kg)  08/31/23 113 lb 12.8 oz (51.6 kg)  04/27/23 116 lb 9.6 oz (52.9 kg)    Physical Exam  Lab Results  Component Value Date   HGBA1C 5.1 08/31/2023   HGBA1C 5.2 07/16/2022    Lab Results  Component Value Date   CREATININE 0.95 08/31/2023   CREATININE 0.90 07/16/2022   CREATININE 0.99 07/09/2021    Lab Results  Component Value Date   WBC 4.3 08/31/2023   HGB 13.6 08/31/2023   HCT 39.6 08/31/2023   PLT 148.0 (L) 08/31/2023   GLUCOSE 93 08/31/2023   CHOL 149 08/31/2023   TRIG 74.0 08/31/2023   HDL 68.80 08/31/2023   LDLDIRECT 67.0 08/31/2023   LDLCALC 65 08/31/2023   ALT 13 08/31/2023   AST 19 08/31/2023   NA 139 08/31/2023   K 3.7 08/31/2023   CL 101 08/31/2023   CREATININE 0.95 08/31/2023   BUN 13 08/31/2023   CO2  30 08/31/2023   TSH 1.26 08/31/2023   HGBA1C 5.1 08/31/2023    US  AORTA DUPLEX LIMITED Result Date: 09/07/2023 CLINICAL DATA:  Abdominal bruit.  Hyperlipidemia. EXAM: ULTRASOUND OF ABDOMINAL AORTA TECHNIQUE: Ultrasound examination of the abdominal aorta and proximal common iliac arteries was performed to evaluate for aneurysm. Additional color and Doppler images of the distal aorta were obtained to document patency. COMPARISON:  None available FINDINGS: Abdominal aortic measurements as follows: Proximal:  2.1 x 2.3 cm Mid:  1.6 x 2.1 cm Distal:  1.6 x 1.9 cm Patent: Yes, peak systolic velocity is 83 cm/s Right common iliac artery: 1.1 x 1.2 cm Left common iliac artery: 0.8 x 0.9 cm IMPRESSION: No abdominal aortic aneurysm Electronically  Signed   By: Luann Rundle  Mir M.D.   On: 09/07/2023 12:46    Assessment & Plan:  .There are no diagnoses linked to this encounter.   I spent 34 minutes on the day of this face to face encounter reviewing patient's  most recent visit with cardiology,  nephrology,  and neurology,  prior relevant surgical and non surgical procedures, recent  labs and imaging studies, counseling on weight management,  reviewing the assessment and plan with patient, and post visit ordering and reviewing of  diagnostics and therapeutics with patient  .   Follow-up: No follow-ups on file.   Stacey Flax, MD

## 2023-09-29 NOTE — Patient Instructions (Addendum)
 You can adjust the bowel regimen as needed.  Ok to use colace and benefiber every day  but stop colace for loose stools  IBGARD may be worth trying  on a daily  basis as a trial  for the abdominal symptoms . It is concentrated pepper mint  In a capsule and can be purchased otc or on amazon   Trial of alprazolam for next trip for anxiety  Increase protonix  to 2 times daily while I try to get Dexilant approved (it's a timed release PPI with 2 peaks)

## 2023-09-30 ENCOUNTER — Telehealth: Payer: Self-pay

## 2023-09-30 ENCOUNTER — Other Ambulatory Visit (HOSPITAL_COMMUNITY): Payer: Self-pay

## 2023-09-30 NOTE — Telephone Encounter (Signed)
 Pharmacy Patient Advocate Encounter   Received notification from CoverMyMeds that prior authorization for Dexlansoprazole 30MG  dr capsules is required/requested.   Insurance verification completed.   The patient is insured through [facility] .   Per test claim: PA required; PA submitted to above mentioned insurance via CoverMyMeds Key/confirmation #/EOC [facility] Status is pending

## 2023-10-04 NOTE — Assessment & Plan Note (Signed)
 Aortic ultrasound ordered to rule out aneurysm and negative.

## 2023-10-04 NOTE — Assessment & Plan Note (Signed)
 Primary problem appears to be both constipation followed by diarrhea  on a daily  basis.   Bbenefiber /colace helping ; recent travel resulted in diarrhea, with patient attributing symptoms to apprehension ans anxiety.  Trial of alprazolam for travel,  and IBGARD for daily use

## 2023-10-04 NOTE — Assessment & Plan Note (Signed)
 Uncontrolled for the last 30 days on once daily protonix   and famotidine .  Protonix  bid recommended,  obtaining PA for Dexilant

## 2023-10-05 ENCOUNTER — Other Ambulatory Visit (HOSPITAL_COMMUNITY): Payer: Self-pay

## 2023-10-05 NOTE — Telephone Encounter (Signed)
 Pharmacy Patient Advocate Encounter  Received notification from [facility] that Prior Authorization for Dexlansoprazole 30mg  DR caps has been APPROVED from 10/01/23 to 09/30/24. Unable to obtain price due to refill too soon rejection, last fill date 10/05/23 next available fill date 10/25/23   PA #/Case ID/Reference #: [id number]

## 2023-10-06 NOTE — Telephone Encounter (Signed)
 Pt is aware and gave a verbal understanding.

## 2023-12-30 DIAGNOSIS — Z130 Encounter for screening for diseases of the blood and blood-forming organs and certain disorders involving the immune mechanism: Secondary | ICD-10-CM | POA: Diagnosis not present

## 2023-12-30 DIAGNOSIS — Z1231 Encounter for screening mammogram for malignant neoplasm of breast: Secondary | ICD-10-CM | POA: Diagnosis not present

## 2023-12-30 DIAGNOSIS — Z01419 Encounter for gynecological examination (general) (routine) without abnormal findings: Secondary | ICD-10-CM | POA: Diagnosis not present

## 2024-01-02 ENCOUNTER — Other Ambulatory Visit: Payer: Self-pay | Admitting: Internal Medicine

## 2024-01-06 ENCOUNTER — Other Ambulatory Visit: Payer: Self-pay | Admitting: Internal Medicine

## 2024-01-07 NOTE — Telephone Encounter (Signed)
 LMTCB. Need to find out if pt has enough medication to get through until Dr. Marylynn returns on Tuesday.

## 2024-01-07 NOTE — Telephone Encounter (Signed)
 Copied from CRM [id number]. Topic: General - Call Back - No Documentation >> Jan 07, 2024 12:37 PM Rea ORN wrote: Reason for CRM: pt returned call from [facility]. Please return call.

## 2024-01-07 NOTE — Telephone Encounter (Signed)
 Called pt back and she stated she has enough until Dr. Marylynn returns on Tuesday LOV 5/25

## 2024-01-23 DIAGNOSIS — R0981 Nasal congestion: Secondary | ICD-10-CM | POA: Diagnosis not present

## 2024-01-23 DIAGNOSIS — U071 COVID-19: Secondary | ICD-10-CM | POA: Diagnosis not present

## 2024-01-23 DIAGNOSIS — J029 Acute pharyngitis, unspecified: Secondary | ICD-10-CM | POA: Diagnosis not present

## 2024-02-15 ENCOUNTER — Encounter: Payer: Self-pay | Admitting: Internal Medicine

## 2024-02-21 DIAGNOSIS — L814 Other melanin hyperpigmentation: Secondary | ICD-10-CM | POA: Diagnosis not present

## 2024-02-21 DIAGNOSIS — L821 Other seborrheic keratosis: Secondary | ICD-10-CM | POA: Diagnosis not present

## 2024-02-21 DIAGNOSIS — L400 Psoriasis vulgaris: Secondary | ICD-10-CM | POA: Diagnosis not present

## 2024-02-21 DIAGNOSIS — D1801 Hemangioma of skin and subcutaneous tissue: Secondary | ICD-10-CM | POA: Diagnosis not present

## 2024-02-24 ENCOUNTER — Ambulatory Visit: Payer: Self-pay

## 2024-02-24 NOTE — Telephone Encounter (Signed)
 noted

## 2024-02-24 NOTE — Telephone Encounter (Signed)
 FYI Only or Action Required?: FYI only for provider.  Patient was last seen in primary care on 09/29/2023 by Marylynn Verneita CROME, MD.  Called Nurse Triage reporting Anxiety.  Symptoms began several weeks ago.  Interventions attempted: Rest, hydration, or home remedies.  Symptoms are: unchanged.  Triage Disposition: See PCP Within 2 Weeks  Patient/caregiver understands and will follow disposition?: Yes    Copied from CRM [id number]. Topic: Clinical - Red Word Triage >> Feb 24, 2024  9:18 AM [place] E wrote: Reason for Triage: Increased anxiety, worsening over the past month. Reason for Disposition  [1] Symptoms of anxiety or panic attack AND [2] is a chronic symptom (recurrent or ongoing AND present > 4 weeks)  Answer Assessment - Initial Assessment Questions Additional info: Wondering if she can have low dose anxiety medication to maintain.     1. CONCERN: Did anything happen that prompted you to call today?      Increasing anxiety over one month/ Increased stress at work. Son is in rehab 2. ANXIETY SYMPTOMS: Can you describe how you (your loved one; patient) have been feeling? (e.g., tense, restless, panicky, anxious, keyed up, overwhelmed, sense of impending doom).      Overwell, short tempered 3. ONSET: How long have you been feeling this way? (e.g., hours, days, weeks)     Chronic, worsening for one month 4. SEVERITY: How would you rate the level of anxiety? (e.g., 0 - 10; or mild, moderate, severe).     Moderate 5. FUNCTIONAL IMPAIRMENT: How have these feelings affected your ability to do daily activities? Have you had more difficulty than usual doing your normal daily activities? (e.g., getting better, same, worse; self-care, school, work, interactions)     same 6. HISTORY: Have you felt this way before? Have you ever been diagnosed with an anxiety problem in the past? (e.g., generalized anxiety disorder, panic attacks, PTSD). If Yes, ask: How was this  problem treated? (e.g., medicines, counseling, etc.)     yes 7. RISK OF HARM - SUICIDAL IDEATION: Do you ever have thoughts of hurting or killing yourself? If Yes, ask:  Do you have these feelings now? Do you have a plan on how you would do this?     denies 8. TREATMENT:  What has been done so far to treat this anxiety? (e.g., medicines, relaxation strategies). What has helped?     Xanax  in the past helped 9. THERAPIST: Do you have a counselor or therapist? If Yes, ask: What is their name?     denies 10. POTENTIAL TRIGGERS: Do you drink caffeinated beverages (e.g., coffee, colas, teas), and how much daily? Do you drink alcohol or use any drugs? Have you started any new medicines recently?       Son recently admitted to rehab 11. PATIENT SUPPORT: Who is with you now? Who do you live with? Do you have family or friends who you can talk to?         47. OTHER SYMPTOMS: Do you have any other symptoms? (e.g., feeling depressed, trouble concentrating, trouble sleeping, trouble breathing, palpitations or fast heartbeat, chest pain, sweating, nausea, or diarrhea)       Loose stool with high anxiety  13. PREGNANCY: Is there any chance you are pregnant? When was your last menstrual period?  Protocols used: Anxiety and Panic Attack-A-AH

## 2024-02-24 NOTE — Telephone Encounter (Signed)
 Pt is scheduled for 02/28/2024 at 5 pm.

## 2024-02-28 ENCOUNTER — Telehealth: Admitting: Internal Medicine

## 2024-02-28 ENCOUNTER — Encounter: Payer: Self-pay | Admitting: Internal Medicine

## 2024-02-28 VITALS — Ht 65.0 in | Wt 112.0 lb

## 2024-02-28 DIAGNOSIS — F419 Anxiety disorder, unspecified: Secondary | ICD-10-CM

## 2024-02-28 MED ORDER — BUSPIRONE HCL 5 MG PO TABS: 5.0000 mg | ORAL_TABLET | Freq: Three times a day (TID) | ORAL | 2 refills | Status: AC

## 2024-02-28 NOTE — Assessment & Plan Note (Addendum)
 Trial of buspirone requested by patient  rather than SSRI , mostly because her hsusband is doing well on buspirone .  Startgin doe of 5 mg sent to [facility],  2-3 taimes daily ,  may increase as needed.  Reviewed nonpharmacologic adjunctive therapy including meditatio,  exercise.

## 2024-02-28 NOTE — Patient Instructions (Signed)
 I am recommending that you start Buspar (buspirone) for your anxiety ., It should be taken twice daily to prevent anxiety and can be increased to 3 times daily if needed.  It should keep you from over using your alprazolam ,  Which can be addicting I have started you on the lowest dose of 5 mg ,  But we can increase it gradually if needed.

## 2024-02-28 NOTE — Progress Notes (Signed)
 Virtual Visit via Caregility   Note   This format is felt to be most appropriate for this patient at this time.  All issues noted in this document were discussed and addressed.  No physical exam was performed (except for noted visual exam findings with Video Visits).   I connected with Stacey [name]  on  at  5:00 PM EDT by a video enabled telemedicine application  and verified that I am speaking with the correct person using two identifiers. Location patient: home Location provider: work or home office Persons participating in the virtual visit: patient, provider  I discussed the limitations, risks, security and privacy concerns of performing an evaluation and management service by telephone and the availability of in person appointments. I also discussed with the patient that there may be a patient responsible charge related to this service. The patient expressed understanding and agreed to proceed.   Reason for visit: anxiety   HPI:   Stacey [name] is a 53 yr old female with a history of IBS  who presents with increased irritability,  trouble sleeping and  increasing active mind  without manic symptoms or panic attacks.  she cites several stressors that have aggravated her symptoms both work related and home related.  Her 20 yr old mother and son live together in [place] and her son was recently treated for alcoholism with a 28 day inpatient program and is doing well.  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 She is waking up frequently at 4 am.  She  states that she is a better person when she takes alprazolam , but does not take if regularly and is requesting a safe daily medication , her    ROS: See pertinent positives and negatives per HPI.  Past Medical History:  Diagnosis Date   Annual physical exam 07/05/2019   Anxiety    Arthritis 2022   Bleeding from the nose    nose cauterized 1980s   GERD  (gastroesophageal reflux disease)    History of cholecystectomy 09/21/2018   IBS (irritable bowel syndrome)    Kidney cysts    Left lower quadrant pain 07/08/2022   has h/o pelvic congestion - had hyst   Meniere disease    left hearing loss 2012    Pelvic congestion syndrome     Past Surgical History:  Procedure Laterality Date   ABDOMINAL HYSTERECTOMY     ovaries intact 2017 2/2 DUB   ABLATION     DUB 10/29/04   APPENDECTOMY     BREAST BIOPSY Right 2015   benign   BREAST EXCISIONAL BIOPSY Right 2010   benign   BREAST SURGERY     bx in 2017/2018 neg dense breast; clip placement right breast, 2008 fibroadenoma breast bx   CHOLECYSTECTOMY     2015   COLONOSCOPY WITH PROPOFOL  N/A 06/18/2022   Procedure: COLONOSCOPY WITH PROPOFOL ;  Surgeon: Unk Corinn Skiff, MD;  Location: [facility] ENDOSCOPY;  Service: Gastroenterology;  Laterality: N/A;   DILATION AND CURETTAGE OF UTERUS     LAPAROSCOPY ABDOMEN DIAGNOSTIC     TONSILLECTOMY     1983   TUBAL LIGATION     WISDOM TOOTH EXTRACTION     1989    Family History  Problem Relation Age of Onset   Arthritis Mother    Hypertension Mother    Arthritis Father    Hypertension Father    Diabetes Father  Hearing loss Father    Hyperlipidemia Father    Cancer Father    Alcohol abuse Son    Cancer Paternal Aunt        gu cancer ? type-endometrial   Rheumatic fever Paternal Aunt    Mental illness Paternal Aunt        cognitive impairment   Arthritis Maternal Grandmother    Diabetes Maternal Grandmother    Hyperlipidemia Maternal Grandmother    Hypertension Maternal Grandmother    Arthritis Maternal Grandfather    Hyperlipidemia Maternal Grandfather    Hypertension Maternal Grandfather    Arthritis Paternal Grandmother    Diabetes Paternal Grandmother    Hyperlipidemia Paternal Grandmother    Hypertension Paternal Grandmother    Arthritis Paternal Grandfather    Hyperlipidemia Paternal Grandfather    Hypertension Paternal  Grandfather    Arthritis Sister    Miscarriages / [place] Sister    Deep vein thrombosis Sister    Scoliosis Sister    Varicose Veins Sister    Breast cancer Neg Hx     SOCIAL HX:  reports that she has never smoked. She has never used smokeless tobacco. She reports current alcohol use. She reports that she does not currently use drugs.    Current Outpatient Medications:    busPIRone (BUSPAR) 5 MG tablet, Take 1 tablet (5 mg total) by mouth 3 (three) times daily., Disp: 90 tablet, Rfl: 2   calcium-vitamin D  (OSCAL WITH D) 500-5 MG-MCG tablet, Take 1 tablet by mouth daily., Disp: , Rfl:    cholestyramine  (QUESTRAN ) 4 g packet, USE 1 PACKET MIXED AS DIRECTED DAILY, Disp: 180 each, Rfl: 3   clobetasol  cream (TEMOVATE ) 0.05 %, Apply 1 application. topically 2 (two) times daily. Prn elbows, Disp: 60 g, Rfl: 2   COLLAGEN PO, Take by mouth., Disp: , Rfl:    Dexlansoprazole  30 MG capsule DR, TAKE ONE CAPSULE BY MOUTH ONE TIME DAILY, Disp: 30 capsule, Rfl: 2   estradiol (VIVELLE-DOT) 0.05 MG/24HR patch, Apply 1 patch twice a week by transdermal route for 28 days., Disp: , Rfl:    Multiple Vitamin (MULTI-VITAMINS PO), , Disp: , Rfl:    Probiotic Product (ALIGN PO), Take 1 capsule by mouth daily., Disp: , Rfl:   EXAM:  VITALS per patient if applicable:  GENERAL: alert, oriented, appears well and in no acute distress  HEENT: atraumatic, conjunttiva clear, no obvious abnormalities on inspection of external nose and ears  NECK: normal movements of the head and neck  LUNGS: on inspection no signs of respiratory distress, breathing rate appears normal, no obvious gross SOB, gasping or wheezing  CV: no obvious cyanosis  MS: moves all visible extremities without noticeable abnormality  PSYCH/NEURO: pleasant and cooperative, no obvious depression or anxiety, speech and thought processing grossly intact  ASSESSMENT AND PLAN: Anxiety Assessment & Plan: Trial of buspirone requested by  patient  rather than SSRI , mostly because her hsusband is doing well on buspirone .  Startgin doe of 5 mg sent to [facility],  2-3 taimes daily ,  may increase as needed.  Reviewed nonpharmacologic adjunctive therapy including meditatio,  exercise.    Other orders -     busPIRone HCl; Take 1 tablet (5 mg total) by mouth 3 (three) times daily.  Dispense: 90 tablet; Refill: 2      I discussed the assessment and treatment plan with the patient. The patient was provided an opportunity to ask questions and all were answered. The patient agreed with the plan  and demonstrated an understanding of the instructions.   The patient was advised to call back or seek an in-person evaluation if the symptoms worsen or if the condition fails to improve as anticipated.   I spent 30 minutes dedicated to the care of this patient on the date of this encounter to include pre-visit review of patient's medical history, face-to-face time with the patient , and post visit ordering of testing and therapeutics.    Verneita LITTIE Kettering, MD

## 2024-03-13 ENCOUNTER — Encounter: Payer: Self-pay | Admitting: Internal Medicine

## 2024-03-13 DIAGNOSIS — K219 Gastro-esophageal reflux disease without esophagitis: Secondary | ICD-10-CM

## 2024-03-13 MED ORDER — PANTOPRAZOLE SODIUM 40 MG PO TBEC: 40.0000 mg | DELAYED_RELEASE_TABLET | Freq: Two times a day (BID) | ORAL | 5 refills | Status: AC

## 2024-03-13 NOTE — Assessment & Plan Note (Signed)
 The only PPI covered as of Nov 2025 is pantoprazole ,  rx bid in lie of dexilant 

## 2024-04-30 ENCOUNTER — Encounter: Payer: Self-pay | Admitting: Internal Medicine

## 2024-05-02 MED ORDER — DEXLANSOPRAZOLE 30 MG PO CPDR: 30.0000 mg | DELAYED_RELEASE_CAPSULE | Freq: Every day | ORAL | 1 refills | Status: AC

## 2024-05-02 NOTE — Telephone Encounter (Signed)
 Medication is not in pt's current medication list. I have updated the pharmacy to the correct [facility].

## 2024-05-03 ENCOUNTER — Other Ambulatory Visit (HOSPITAL_COMMUNITY): Payer: Self-pay

## 2024-05-03 ENCOUNTER — Telehealth: Payer: Self-pay

## 2024-05-03 NOTE — Telephone Encounter (Signed)
 Pharmacy Patient Advocate Encounter   Received notification from Physician's Office that prior authorization for Dexilant  30MG  dr capsules is required/requested.   Insurance verification completed.   The patient is insured through [facility]   Per test claim: PA required; PA submitted to above mentioned insurance via Latent Key/confirmation #/EOC [id number] Status is pending

## 2024-05-09 ENCOUNTER — Other Ambulatory Visit (HOSPITAL_COMMUNITY): Payer: Self-pay

## 2024-05-09 NOTE — Telephone Encounter (Signed)
 Pt notified via mychart

## 2024-05-09 NOTE — Telephone Encounter (Signed)
 See Dexilant  30MG  dr capsules approval below.

## 2024-05-09 NOTE — Telephone Encounter (Signed)
 Pharmacy Patient Advocate Encounter  Received notification from [facility] that Prior Authorization for  Dexilant  30MG  dr capsules  has been APPROVED from 05/03/24 to 05/03/25. Unable to obtain price due to refill too soon rejection, last fill date 05/07/24 next available fill date01/25/26   PA #/Case ID/Reference #: [id number]
